# Patient Record
Sex: Female | Born: 1995 | Race: White | Hispanic: No | Marital: Single
Health system: Midwestern US, Academic
[De-identification: ages and names within clinical notes are randomized; demographics above are authoritative.]

---
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
## 2017-02-08 ENCOUNTER — Encounter: Admit: 2017-02-08 | Discharge: 2017-02-09 | Payer: Commercial Managed Care - PPO

## 2017-03-16 ENCOUNTER — Encounter: Admit: 2017-03-16 | Discharge: 2017-03-16 | Payer: Commercial Managed Care - PPO

## 2017-03-16 ENCOUNTER — Ambulatory Visit: Admit: 2017-03-16 | Discharge: 2017-03-17 | Payer: 59

## 2017-03-16 DIAGNOSIS — R569 Unspecified convulsions: ICD-10-CM
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
## 2017-03-16 NOTE — Assessment & Plan Note
Will refer to rehab medicine/TBI clinic for ongoing questions regarding spasticity management.

## 2017-03-17 ENCOUNTER — Encounter: Admit: 2017-03-17 | Discharge: 2017-03-17 | Payer: Commercial Managed Care - PPO

## 2017-03-17 DIAGNOSIS — S069X9S Unspecified intracranial injury with loss of consciousness of unspecified duration, sequela: Principal | ICD-10-CM

## 2017-03-17 NOTE — Telephone Encounter
Spoke with patient mom to schedule with Dr. Tammi KlippelArickx per referral from Dr. Jeraldine LootsHammond. Per conversation with mom, [facility] and Dr. Georgiann CockerMyles has recommended that she do botox injections for her spasticity. Family is requesting a second opinion. Mom also expressed that Dr. Tammi KlippelArickx, may be patient primary rehab physician as it is possible that patient will be moving home in January and February. Appointment and contact information provided.

## 2017-06-07 ENCOUNTER — Ambulatory Visit: Admit: 2017-06-07 | Discharge: 2017-06-07

## 2017-06-07 ENCOUNTER — Encounter: Admit: 2017-06-07 | Discharge: 2017-06-07 | Payer: Commercial Managed Care - PPO

## 2017-06-07 DIAGNOSIS — R4701 Aphasia: ICD-10-CM

## 2017-06-07 DIAGNOSIS — R1312 Dysphagia, oropharyngeal phase: Principal | ICD-10-CM

## 2017-06-07 DIAGNOSIS — Z9889 Other specified postprocedural states: ICD-10-CM

## 2017-06-16 ENCOUNTER — Ambulatory Visit: Admit: 2017-06-16 | Discharge: 2017-06-16

## 2017-06-16 DIAGNOSIS — R1312 Dysphagia, oropharyngeal phase: Principal | ICD-10-CM

## 2017-06-20 ENCOUNTER — Encounter: Admit: 2017-06-20 | Discharge: 2017-06-20 | Payer: Commercial Managed Care - PPO

## 2017-07-10 ENCOUNTER — Ambulatory Visit: Admit: 2017-07-10 | Discharge: 2017-07-11

## 2017-07-10 ENCOUNTER — Encounter: Admit: 2017-07-10 | Discharge: 2017-07-10 | Payer: Commercial Managed Care - PPO

## 2017-07-10 DIAGNOSIS — S069X9S Unspecified intracranial injury with loss of consciousness of unspecified duration, sequela: Secondary | ICD-10-CM

## 2017-07-10 DIAGNOSIS — G8250 Quadriplegia, unspecified: ICD-10-CM

## 2017-07-10 DIAGNOSIS — R252 Cramp and spasm: ICD-10-CM

## 2017-07-10 DIAGNOSIS — R239 Unspecified skin changes: ICD-10-CM

## 2017-07-10 DIAGNOSIS — N920 Excessive and frequent menstruation with regular cycle: ICD-10-CM

## 2017-07-14 ENCOUNTER — Encounter: Admit: 2017-07-14 | Discharge: 2017-07-14 | Payer: Commercial Managed Care - PPO

## 2017-07-14 DIAGNOSIS — S069X9A Unspecified intracranial injury with loss of consciousness of unspecified duration, initial encounter: Principal | ICD-10-CM

## 2017-07-26 ENCOUNTER — Encounter: Admit: 2017-07-26 | Discharge: 2017-07-26 | Payer: Commercial Managed Care - PPO

## 2017-08-03 ENCOUNTER — Encounter: Admit: 2017-08-03 | Discharge: 2017-08-03 | Payer: Commercial Managed Care - PPO

## 2017-08-03 DIAGNOSIS — S069X9A Unspecified intracranial injury with loss of consciousness of unspecified duration, initial encounter: Principal | ICD-10-CM

## 2017-08-04 ENCOUNTER — Encounter: Admit: 2017-08-04 | Discharge: 2017-08-04 | Payer: Commercial Managed Care - PPO

## 2017-09-20 ENCOUNTER — Encounter: Admit: 2017-09-20 | Discharge: 2017-09-20 | Payer: Commercial Managed Care - PPO

## 2017-09-20 MED ORDER — INCOBOTULINUMTOXINA 100 UNIT IM SOLR
500 [IU] | Freq: Once | INTRAMUSCULAR | 0 refills | Status: CP
Start: 2017-09-20 — End: ?
  Administered 2017-10-23: 19:00:00 500 [IU] via INTRAMUSCULAR

## 2017-09-22 ENCOUNTER — Encounter: Admit: 2017-09-22 | Discharge: 2017-09-22 | Payer: Commercial Managed Care - PPO

## 2017-10-04 ENCOUNTER — Encounter: Admit: 2017-10-04 | Discharge: 2017-10-04 | Payer: Commercial Managed Care - PPO

## 2017-10-06 ENCOUNTER — Encounter: Admit: 2017-10-06 | Discharge: 2017-10-06 | Payer: Commercial Managed Care - PPO

## 2017-10-23 ENCOUNTER — Ambulatory Visit: Admit: 2017-10-23 | Discharge: 2017-10-24

## 2017-10-23 ENCOUNTER — Encounter: Admit: 2017-10-23 | Discharge: 2017-10-23 | Payer: Commercial Managed Care - PPO

## 2017-10-23 DIAGNOSIS — Z7409 Other reduced mobility: ICD-10-CM

## 2017-10-23 DIAGNOSIS — S069X9A Unspecified intracranial injury with loss of consciousness of unspecified duration, initial encounter: Principal | ICD-10-CM

## 2017-10-23 DIAGNOSIS — M25671 Stiffness of right ankle, not elsewhere classified: ICD-10-CM

## 2017-10-24 DIAGNOSIS — R252 Cramp and spasm: Principal | ICD-10-CM

## 2017-11-03 ENCOUNTER — Ambulatory Visit: Admit: 2017-11-03 | Discharge: 2017-11-04

## 2017-11-03 ENCOUNTER — Encounter: Admit: 2017-11-03 | Discharge: 2017-11-03 | Payer: Commercial Managed Care - PPO

## 2017-11-03 DIAGNOSIS — S069X6D Unspecified intracranial injury with loss of consciousness greater than 24 hours without return to pre-existing conscious level with patient surviving, subsequent encounter: ICD-10-CM

## 2017-11-03 DIAGNOSIS — R1312 Dysphagia, oropharyngeal phase: Principal | ICD-10-CM

## 2017-11-03 DIAGNOSIS — R4701 Aphasia: ICD-10-CM

## 2017-11-03 DIAGNOSIS — Z9889 Other specified postprocedural states: ICD-10-CM

## 2017-11-03 DIAGNOSIS — S069X9A Unspecified intracranial injury with loss of consciousness of unspecified duration, initial encounter: Principal | ICD-10-CM

## 2017-11-25 ENCOUNTER — Encounter: Admit: 2017-11-25 | Discharge: 2017-11-25 | Payer: Commercial Managed Care - PPO

## 2017-11-25 DIAGNOSIS — I2699 Other pulmonary embolism without acute cor pulmonale: ICD-10-CM

## 2017-11-25 DIAGNOSIS — I639 Cerebral infarction, unspecified: ICD-10-CM

## 2017-11-25 DIAGNOSIS — S069X9A Unspecified intracranial injury with loss of consciousness of unspecified duration, initial encounter: Principal | ICD-10-CM

## 2017-11-30 ENCOUNTER — Encounter: Admit: 2017-11-30 | Discharge: 2017-11-30 | Payer: Commercial Managed Care - PPO

## 2017-12-21 ENCOUNTER — Encounter: Admit: 2017-12-21 | Discharge: 2017-12-21 | Payer: Commercial Managed Care - PPO

## 2017-12-21 MED ORDER — INCOBOTULINUMTOXINA 100 UNIT IM SOLR
500 [IU] | Freq: Once | INTRAMUSCULAR | 0 refills | Status: CP
Start: 2017-12-21 — End: ?
  Administered 2018-01-24: 19:00:00 500 [IU] via INTRAMUSCULAR

## 2018-01-08 ENCOUNTER — Encounter: Admit: 2018-01-08 | Discharge: 2018-01-08 | Payer: Commercial Managed Care - PPO

## 2018-01-08 ENCOUNTER — Ambulatory Visit: Admit: 2018-01-08 | Discharge: 2018-01-08

## 2018-01-08 ENCOUNTER — Ambulatory Visit: Admit: 2018-01-08 | Discharge: 2018-01-08 | Payer: Commercial Managed Care - PPO

## 2018-01-08 DIAGNOSIS — S069X9A Unspecified intracranial injury with loss of consciousness of unspecified duration, initial encounter: Principal | ICD-10-CM

## 2018-01-08 DIAGNOSIS — I2699 Other pulmonary embolism without acute cor pulmonale: ICD-10-CM

## 2018-01-08 DIAGNOSIS — G40209 Localization-related (focal) (partial) symptomatic epilepsy and epileptic syndromes with complex partial seizures, not intractable, without status epilepticus: Principal | ICD-10-CM

## 2018-01-08 DIAGNOSIS — I639 Cerebral infarction, unspecified: ICD-10-CM

## 2018-01-08 MED ORDER — LAMOTRIGINE 25 MG PO TAB
50 mg | ORAL_TABLET | Freq: Two times a day (BID) | ORAL | 5 refills | Status: AC
Start: 2018-01-08 — End: 2018-02-01

## 2018-01-08 MED ORDER — LAMOTRIGINE 100 MG PO TAB
100 mg | ORAL_TABLET | Freq: Two times a day (BID) | ORAL | 5 refills | Status: AC
Start: 2018-01-08 — End: 2018-02-01

## 2018-01-24 ENCOUNTER — Ambulatory Visit: Admit: 2018-01-24 | Discharge: 2018-01-25

## 2018-01-24 ENCOUNTER — Encounter: Admit: 2018-01-24 | Discharge: 2018-01-24 | Payer: Commercial Managed Care - PPO

## 2018-01-24 DIAGNOSIS — S069X9A Unspecified intracranial injury with loss of consciousness of unspecified duration, initial encounter: Principal | ICD-10-CM

## 2018-01-24 DIAGNOSIS — I639 Cerebral infarction, unspecified: ICD-10-CM

## 2018-01-24 DIAGNOSIS — I2699 Other pulmonary embolism without acute cor pulmonale: ICD-10-CM

## 2018-01-24 DIAGNOSIS — M25661 Stiffness of right knee, not elsewhere classified: ICD-10-CM

## 2018-01-25 DIAGNOSIS — R252 Cramp and spasm: Principal | ICD-10-CM

## 2018-02-01 ENCOUNTER — Encounter: Admit: 2018-02-01 | Discharge: 2018-02-01 | Payer: Commercial Managed Care - PPO

## 2018-02-01 MED ORDER — LEVETIRACETAM 500 MG PO TAB
500 mg | ORAL_TABLET | Freq: Two times a day (BID) | ORAL | 3 refills | 90.00000 days | Status: AC
Start: 2018-02-01 — End: 2018-05-31

## 2018-03-22 ENCOUNTER — Encounter: Admit: 2018-03-22 | Discharge: 2018-03-22 | Payer: Commercial Managed Care - PPO

## 2018-03-26 ENCOUNTER — Encounter: Admit: 2018-03-26 | Discharge: 2018-03-26 | Payer: Commercial Managed Care - PPO

## 2018-03-26 DIAGNOSIS — R252 Cramp and spasm: Principal | ICD-10-CM

## 2018-03-26 MED ORDER — INCOBOTULINUMTOXINA 100 UNIT IM SOLR
500 [IU] | Freq: Once | INTRAMUSCULAR | 0 refills | Status: CP
Start: 2018-03-26 — End: ?
  Administered 2018-04-30: 21:00:00 500 [IU] via INTRAMUSCULAR

## 2018-04-30 ENCOUNTER — Encounter: Admit: 2018-04-30 | Discharge: 2018-04-30 | Payer: Commercial Managed Care - PPO

## 2018-04-30 ENCOUNTER — Ambulatory Visit: Admit: 2018-04-30 | Discharge: 2018-05-01

## 2018-04-30 DIAGNOSIS — S069X9S Unspecified intracranial injury with loss of consciousness of unspecified duration, sequela: ICD-10-CM

## 2018-04-30 DIAGNOSIS — S069X9A Unspecified intracranial injury with loss of consciousness of unspecified duration, initial encounter: Principal | ICD-10-CM

## 2018-04-30 DIAGNOSIS — I639 Cerebral infarction, unspecified: ICD-10-CM

## 2018-04-30 DIAGNOSIS — I2699 Other pulmonary embolism without acute cor pulmonale: ICD-10-CM

## 2018-05-01 DIAGNOSIS — R252 Cramp and spasm: Principal | ICD-10-CM

## 2018-05-18 ENCOUNTER — Encounter: Admit: 2018-05-18 | Discharge: 2018-05-18 | Payer: Commercial Managed Care - PPO

## 2018-05-18 ENCOUNTER — Ambulatory Visit: Admit: 2018-05-18 | Discharge: 2018-05-19

## 2018-05-18 DIAGNOSIS — J9504 Tracheo-esophageal fistula following tracheostomy: Secondary | ICD-10-CM

## 2018-05-18 DIAGNOSIS — S069X9A Unspecified intracranial injury with loss of consciousness of unspecified duration, initial encounter: Secondary | ICD-10-CM

## 2018-05-18 DIAGNOSIS — I639 Cerebral infarction, unspecified: Secondary | ICD-10-CM

## 2018-05-18 DIAGNOSIS — I2699 Other pulmonary embolism without acute cor pulmonale: Secondary | ICD-10-CM

## 2018-05-18 DIAGNOSIS — Z9889 Other specified postprocedural states: Secondary | ICD-10-CM

## 2018-05-18 DIAGNOSIS — R1314 Dysphagia, pharyngoesophageal phase: Secondary | ICD-10-CM

## 2018-05-18 DIAGNOSIS — R1312 Dysphagia, oropharyngeal phase: Secondary | ICD-10-CM

## 2018-05-18 DIAGNOSIS — G8250 Quadriplegia, unspecified: Secondary | ICD-10-CM

## 2018-05-18 DIAGNOSIS — R4701 Aphasia: Secondary | ICD-10-CM

## 2018-05-24 ENCOUNTER — Encounter: Admit: 2018-05-24 | Discharge: 2018-05-24 | Payer: Commercial Managed Care - PPO

## 2018-05-24 DIAGNOSIS — S069X9A Unspecified intracranial injury with loss of consciousness of unspecified duration, initial encounter: Secondary | ICD-10-CM

## 2018-05-24 DIAGNOSIS — I639 Cerebral infarction, unspecified: Secondary | ICD-10-CM

## 2018-05-24 DIAGNOSIS — I2699 Other pulmonary embolism without acute cor pulmonale: Secondary | ICD-10-CM

## 2018-05-31 ENCOUNTER — Ambulatory Visit: Admit: 2018-05-31 | Discharge: 2018-06-01

## 2018-05-31 ENCOUNTER — Encounter: Admit: 2018-05-31 | Discharge: 2018-05-31 | Payer: Commercial Managed Care - PPO

## 2018-05-31 DIAGNOSIS — I639 Cerebral infarction, unspecified: Secondary | ICD-10-CM

## 2018-05-31 DIAGNOSIS — I2699 Other pulmonary embolism without acute cor pulmonale: Secondary | ICD-10-CM

## 2018-05-31 DIAGNOSIS — G5139 Clonic hemifacial spasm, unspecified: Secondary | ICD-10-CM

## 2018-05-31 DIAGNOSIS — S069X9A Unspecified intracranial injury with loss of consciousness of unspecified duration, initial encounter: Secondary | ICD-10-CM

## 2018-05-31 DIAGNOSIS — G40209 Localization-related (focal) (partial) symptomatic epilepsy and epileptic syndromes with complex partial seizures, not intractable, without status epilepticus: Secondary | ICD-10-CM

## 2018-05-31 MED ORDER — GABAPENTIN 250 MG/5 ML (5 ML) PO SOLN
125 mg | Freq: Three times a day (TID) | ORAL | 3 refills | Status: AC
Start: 2018-05-31 — End: 2018-11-29

## 2018-06-13 ENCOUNTER — Ambulatory Visit: Admit: 2018-06-13 | Discharge: 2018-06-13 | Payer: Commercial Managed Care - PPO

## 2018-06-14 ENCOUNTER — Encounter: Admit: 2018-06-14 | Discharge: 2018-06-14 | Payer: Commercial Managed Care - PPO

## 2018-06-14 ENCOUNTER — Ambulatory Visit: Admit: 2018-06-14 | Discharge: 2018-06-14 | Payer: Commercial Managed Care - PPO

## 2018-06-14 ENCOUNTER — Ambulatory Visit: Admit: 2018-06-14 | Discharge: 2018-06-14

## 2018-06-14 DIAGNOSIS — R1314 Dysphagia, pharyngoesophageal phase: ICD-10-CM

## 2018-06-14 DIAGNOSIS — I2699 Other pulmonary embolism without acute cor pulmonale: ICD-10-CM

## 2018-06-14 DIAGNOSIS — G8250 Quadriplegia, unspecified: ICD-10-CM

## 2018-06-14 DIAGNOSIS — Z87820 Personal history of traumatic brain injury: Secondary | ICD-10-CM

## 2018-06-14 DIAGNOSIS — J9504 Tracheo-esophageal fistula following tracheostomy: Principal | ICD-10-CM

## 2018-06-14 DIAGNOSIS — R4701 Aphasia: ICD-10-CM

## 2018-06-14 DIAGNOSIS — S069X9A Unspecified intracranial injury with loss of consciousness of unspecified duration, initial encounter: Principal | ICD-10-CM

## 2018-06-14 DIAGNOSIS — I639 Cerebral infarction, unspecified: ICD-10-CM

## 2018-06-14 LAB — BETA-HCG: Lab: <1 U/L — ABNORMAL LOW (ref <5)

## 2018-06-14 MED ORDER — CEFAZOLIN 1 GRAM IJ SOLR
0 refills | Status: DC
Start: 2018-06-14 — End: 2018-06-14
  Administered 2018-06-14: 17:00:00 2 g via INTRAVENOUS

## 2018-06-14 MED ORDER — PROPOFOL 10 MG/ML IV EMUL 50 ML (INFUSION)(AM)(OR)
INTRAVENOUS | 0 refills | Status: DC
Start: 2018-06-14 — End: 2018-06-14
  Administered 2018-06-14: 17:00:00 20 ug/kg/min via INTRAVENOUS

## 2018-06-14 MED ORDER — FENTANYL CITRATE (PF) 50 MCG/ML IJ SOLN
50 ug | INTRAVENOUS | 0 refills | Status: DC | PRN
Start: 2018-06-14 — End: 2018-06-14

## 2018-06-14 MED ORDER — CEPHALEXIN 500 MG PO CAP
500 mg | ORAL_CAPSULE | Freq: Three times a day (TID) | ORAL | 0 refills | Status: AC
Start: 2018-06-14 — End: ?

## 2018-06-14 MED ORDER — OXYCODONE 5 MG PO TAB
5-10 mg | Freq: Once | ORAL | 0 refills | Status: DC | PRN
Start: 2018-06-14 — End: 2018-06-14

## 2018-06-14 MED ORDER — LIDOCAINE 1%-EPINEPHRINE 1:100000 (BUFFERED) VIAL
0 refills | Status: DC
Start: 2018-06-14 — End: 2018-06-14
  Administered 2018-06-14 (×2): 10 mL via INTRAMUSCULAR

## 2018-06-14 MED ORDER — HYDROMORPHONE (PF) 2 MG/ML IJ SYRG
1-2 mg | Freq: Once | INTRAVENOUS | 0 refills | Status: DC | PRN
Start: 2018-06-14 — End: 2018-06-14

## 2018-06-14 MED ORDER — LIDOCAINE (PF) 10 MG/ML (1 %) IJ SOLN
.1-2 mL | INTRAMUSCULAR | 0 refills | Status: DC | PRN
Start: 2018-06-14 — End: 2018-06-14

## 2018-06-14 MED ORDER — TRAMADOL 50 MG PO TAB
50 mg | ORAL_TABLET | GASTROSTOMY | 0 refills | Status: AC | PRN
Start: 2018-06-14 — End: 2019-03-06

## 2018-06-14 MED ORDER — PROMETHAZINE 25 MG/ML IJ SOLN
6.25 mg | INTRAVENOUS | 0 refills | Status: DC | PRN
Start: 2018-06-14 — End: 2018-06-14

## 2018-06-14 MED ORDER — FENTANYL CITRATE (PF) 50 MCG/ML IJ SOLN
25 ug | INTRAVENOUS | 0 refills | Status: DC | PRN
Start: 2018-06-14 — End: 2018-06-14

## 2018-06-14 MED ORDER — HALOPERIDOL LACTATE 5 MG/ML IJ SOLN
1 mg | Freq: Once | INTRAVENOUS | 0 refills | Status: DC | PRN
Start: 2018-06-14 — End: 2018-06-14

## 2018-06-14 MED ORDER — DIPHENHYDRAMINE HCL 50 MG/ML IJ SOLN
25 mg | Freq: Once | INTRAVENOUS | 0 refills | Status: DC | PRN
Start: 2018-06-14 — End: 2018-06-14

## 2018-06-14 MED ORDER — SODIUM CHLORIDE 0.9 % IV SOLP
INTRAVENOUS | 0 refills | Status: DC
Start: 2018-06-14 — End: 2018-06-14

## 2018-06-14 MED ADMIN — SODIUM CHLORIDE 0.9 % IV SOLP [27838]: INTRAVENOUS | @ 15:00:00 | Stop: 2018-06-14 | NDC 00338004904

## 2018-06-14 NOTE — Anesthesia Pre-Procedure Evaluation
Anesthesia Pre-Procedure Evaluation    Name: Yumna Ebers      MRN: [id number]     DOB: [birth year]     Age: 23 y.o.     Sex: female   __________________________________________________________________________     Procedure Date: 06/14/2018   Procedure: Procedure(s) with comments:  SURGICAL CLOSURE TRACHEOSTOMY/ FISTULA WITHOUT PLASTIC REPAIR - CASE LENGTH 1 HOUR, HEAD AND NECK SET     Physical Assessment  Vital Signs (last filed in past 24 hours):  BP: 132/82 (02/13 0849)  Temp: 36.5 ???C (97.7 ???F) (02/13 0820)  Pulse: 76 (02/13 0820)  Respirations: 13 PER MINUTE (02/13 0820)  SpO2: 100 % (02/13 0820)  Height: 172.7 cm (68) (02/13 0820)  Weight: 79.4 kg (175 lb) (02/13 0820)`      Patient History  Allergies   Allergen Reactions   ??? Amantadine RASH and UNKNOWN   ??? Lamictal [Lamotrigine] RASH        Current Medications    Medication Directions   acetaminophen SR(+) (TYLENOL) 650 mg tablet Take 650 mg by mouth every 6 hours as needed for Pain.   baclofen (LIORESAL) 20 mg tablet Take 20 mg by mouth three times daily.   bisacodyl (DULCOLAX) 10 mg rectal suppository Insert or Apply 10 mg to rectal area as directed as Needed.   CALCIUM PO Take 600 mg by mouth. Indications: with Vitamin D   chlorhexidine gluconate (PERIDEX) 0.12 % solution Swish and Spit 15 mL by mouth as directed twice daily.   cloNIDine (CATAPRESS) 0.1 mg tablet Take 0.5 mg by mouth 1 & 3 hours after meals and at bedtime as needed.   cyanocobalamin (VITAMIN B-12) 100 mcg tablet Take 100 mcg by mouth daily.   Cyanocobalamin 1,000 mcg/15 mL liqd Take 1,000 mcg by mouth daily.   DIAZEPAM PO Take 2.5 mg by mouth every 6 hours as needed.   docosahexanoic acid/epa (FISH OIL PO) Take 1,000 Units by mouth daily.   esomeprazole DR(+) (NEXIUM) 20 mg capsule Take 20 mg by mouth daily as needed. Take on an empty stomach at least 1 hour before or 2 hours after food.    gabapentin 250 mg/5 mL (5 mL) soln Take 125 mg by mouth three times daily. 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 To remain on for 6 weeks.     Physical Exam    Airway Findings      Mallampati: II      TM distance: >3 FB      Neck ROM: limited      Pre-existing airway: ETT  Dental Findings:       Poor dentition and increased risk for dental injury; pt advised      Comments: UTA    Cardiovascular Findings: Negative      Rhythm: regular      Pulmonary Findings: Negative      Breath sounds clear to auscultation.    Neurological Findings:         Altered mental status      Paralysis       Diagnostic Tests  Hematology:   Lab Results   Component Value Date    HGB 11.5 04/06/2016    HCT 33.8 04/06/2016    PLTCT 384 04/06/2016    WBC 16.6 04/06/2016    NEUT 74 03/30/2016    ANC 8.90 03/30/2016    ALC 2.20 03/30/2016    MONA 6 03/30/2016    AMC 0.80 03/30/2016    EOSA 2 03/30/2016    ABC 0.00 03/30/2016    MCV 89.6 04/06/2016    MCH 30.5 04/06/2016    MCHC 34.0 04/06/2016    MPV 8.1 04/06/2016    RDW 13.8 04/06/2016         General Chemistry:   Lab Results   Component Value Date    NA 139 04/06/2016    K 4.4 04/06/2016    CL 103 04/06/2016    CO2 27 04/06/2016    GAP 9 04/06/2016    BUN 33 04/06/2016    CR 0.55 04/06/2016    GLU 142 04/06/2016    CA 10.1 04/06/2016    ALBUMIN 3.4 03/24/2016    LACTIC 2.6 03/24/2016    MG 2.1 04/06/2016    TOTBILI 0.4 03/24/2016    PO4 4.1 04/06/2016      Coagulation:   Lab Results   Component Value Date PTT 68.1 04/05/2016    INR 1.1 04/06/2016         Anesthesia Plan    ASA score: 3   Plan: general  Induction method: intravenous  NPO status: acceptable      Informed Consent  Anesthetic plan and risks discussed with mother.  Use of blood products discussed with mother  Blood Consent: consented      Plan discussed with: anesthesiologist and resident.

## 2018-06-16 ENCOUNTER — Encounter: Admit: 2018-06-16 | Discharge: 2018-06-16 | Payer: Commercial Managed Care - PPO

## 2018-06-16 DIAGNOSIS — I639 Cerebral infarction, unspecified: ICD-10-CM

## 2018-06-16 DIAGNOSIS — I2699 Other pulmonary embolism without acute cor pulmonale: ICD-10-CM

## 2018-06-16 DIAGNOSIS — S069X9A Unspecified intracranial injury with loss of consciousness of unspecified duration, initial encounter: Principal | ICD-10-CM

## 2018-06-18 ENCOUNTER — Encounter: Admit: 2018-06-18 | Discharge: 2018-06-18 | Payer: Commercial Managed Care - PPO

## 2018-06-18 DIAGNOSIS — Z7409 Other reduced mobility: ICD-10-CM

## 2018-06-18 DIAGNOSIS — G8250 Quadriplegia, unspecified: ICD-10-CM

## 2018-06-18 DIAGNOSIS — R252 Cramp and spasm: Principal | ICD-10-CM

## 2018-06-21 NOTE — Progress Notes
Pre Visit Planning- Return Patient     Last office visit note reviewed: Yes     Records received: N/A     Orders have been NA    Patient active in MyChart. No appointment reminder sent.     Imaging: N/A    Special Equipment: N/A

## 2018-06-25 ENCOUNTER — Encounter: Admit: 2018-06-25 | Discharge: 2018-06-25 | Payer: Commercial Managed Care - PPO

## 2018-06-28 ENCOUNTER — Encounter: Admit: 2018-06-28 | Discharge: 2018-06-28 | Payer: Commercial Managed Care - PPO

## 2018-06-29 ENCOUNTER — Encounter: Admit: 2018-06-29 | Discharge: 2018-06-29 | Payer: Commercial Managed Care - PPO

## 2018-06-29 ENCOUNTER — Ambulatory Visit: Admit: 2018-06-29 | Discharge: 2018-06-30

## 2018-06-29 DIAGNOSIS — R1312 Dysphagia, oropharyngeal phase: Principal | ICD-10-CM

## 2018-06-29 DIAGNOSIS — S069X9A Unspecified intracranial injury with loss of consciousness of unspecified duration, initial encounter: Principal | ICD-10-CM

## 2018-06-29 DIAGNOSIS — Z9889 Other specified postprocedural states: ICD-10-CM

## 2018-06-29 DIAGNOSIS — I2699 Other pulmonary embolism without acute cor pulmonale: ICD-10-CM

## 2018-06-29 DIAGNOSIS — J9504 Tracheo-esophageal fistula following tracheostomy: ICD-10-CM

## 2018-06-29 DIAGNOSIS — I639 Cerebral infarction, unspecified: ICD-10-CM

## 2018-07-30 ENCOUNTER — Encounter: Admit: 2018-07-30 | Discharge: 2018-07-30 | Payer: Commercial Managed Care - PPO

## 2018-07-31 ENCOUNTER — Encounter: Admit: 2018-07-31 | Discharge: 2018-07-31 | Payer: Commercial Managed Care - PPO

## 2018-07-31 DIAGNOSIS — R252 Cramp and spasm: Principal | ICD-10-CM

## 2018-08-13 ENCOUNTER — Encounter: Admit: 2018-08-13 | Discharge: 2018-08-13 | Payer: Commercial Managed Care - PPO

## 2018-08-14 ENCOUNTER — Encounter: Admit: 2018-08-14 | Discharge: 2018-08-14 | Payer: Commercial Managed Care - PPO

## 2018-08-22 ENCOUNTER — Ambulatory Visit: Admit: 2018-08-22 | Discharge: 2018-08-23

## 2018-08-22 ENCOUNTER — Encounter: Admit: 2018-08-22 | Discharge: 2018-08-22 | Payer: Commercial Managed Care - PPO

## 2018-08-22 DIAGNOSIS — I639 Cerebral infarction, unspecified: ICD-10-CM

## 2018-08-22 DIAGNOSIS — I2699 Other pulmonary embolism without acute cor pulmonale: ICD-10-CM

## 2018-08-22 DIAGNOSIS — S069X9A Unspecified intracranial injury with loss of consciousness of unspecified duration, initial encounter: Principal | ICD-10-CM

## 2018-08-22 MED ORDER — INCOBOTULINUMTOXINA 100 UNIT IM SOLR
500 [IU] | Freq: Once | INTRAMUSCULAR | 0 refills | Status: CP
Start: 2018-08-22 — End: ?
  Administered 2018-08-22: 16:00:00 500 [IU] via INTRAMUSCULAR

## 2018-08-23 DIAGNOSIS — R252 Cramp and spasm: Principal | ICD-10-CM

## 2018-09-19 ENCOUNTER — Encounter: Admit: 2018-09-19 | Discharge: 2018-09-19 | Payer: Commercial Managed Care - PPO

## 2018-09-28 ENCOUNTER — Encounter: Admit: 2018-09-28 | Discharge: 2018-09-28 | Payer: Commercial Managed Care - PPO

## 2018-10-24 ENCOUNTER — Encounter: Admit: 2018-10-24 | Discharge: 2018-10-24

## 2018-10-24 ENCOUNTER — Ambulatory Visit: Admit: 2018-10-24 | Discharge: 2018-10-25

## 2018-10-24 DIAGNOSIS — S069X9A Unspecified intracranial injury with loss of consciousness of unspecified duration, initial encounter: Secondary | ICD-10-CM

## 2018-10-24 DIAGNOSIS — I639 Cerebral infarction, unspecified: Secondary | ICD-10-CM

## 2018-10-24 DIAGNOSIS — I2699 Other pulmonary embolism without acute cor pulmonale: Secondary | ICD-10-CM

## 2018-10-24 DIAGNOSIS — S069X6D Unspecified intracranial injury with loss of consciousness greater than 24 hours without return to pre-existing conscious level with patient surviving, subsequent encounter: Secondary | ICD-10-CM

## 2018-10-24 DIAGNOSIS — S062X6A Diffuse traumatic brain injury with loss of consciousness greater than 24 hours without return to pre-existing conscious level with patient surviving, initial encounter: Secondary | ICD-10-CM
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
## 2018-11-20 ENCOUNTER — Encounter: Admit: 2018-11-20 | Discharge: 2018-11-20

## 2018-11-20 ENCOUNTER — Ambulatory Visit: Admit: 2018-11-20 | Discharge: 2018-11-21

## 2018-11-20 DIAGNOSIS — I2699 Other pulmonary embolism without acute cor pulmonale: Secondary | ICD-10-CM

## 2018-11-20 DIAGNOSIS — J9504 Tracheo-esophageal fistula following tracheostomy: Secondary | ICD-10-CM

## 2018-11-20 DIAGNOSIS — S069X9A Unspecified intracranial injury with loss of consciousness of unspecified duration, initial encounter: Secondary | ICD-10-CM

## 2018-11-20 DIAGNOSIS — R1312 Dysphagia, oropharyngeal phase: Principal | ICD-10-CM

## 2018-11-20 DIAGNOSIS — R4701 Aphasia: Secondary | ICD-10-CM

## 2018-11-20 DIAGNOSIS — I639 Cerebral infarction, unspecified: Secondary | ICD-10-CM

## 2018-11-20 DIAGNOSIS — Z9889 Other specified postprocedural states: Secondary | ICD-10-CM

## 2018-11-29 ENCOUNTER — Encounter: Admit: 2018-11-29 | Discharge: 2018-11-29

## 2018-11-29 ENCOUNTER — Ambulatory Visit: Admit: 2018-11-29 | Discharge: 2018-11-30

## 2018-11-29 DIAGNOSIS — G40209 Localization-related (focal) (partial) symptomatic epilepsy and epileptic syndromes with complex partial seizures, not intractable, without status epilepticus: Secondary | ICD-10-CM

## 2018-11-29 DIAGNOSIS — I2699 Other pulmonary embolism without acute cor pulmonale: Secondary | ICD-10-CM

## 2018-11-29 DIAGNOSIS — I639 Cerebral infarction, unspecified: Secondary | ICD-10-CM

## 2018-11-29 DIAGNOSIS — S069X9A Unspecified intracranial injury with loss of consciousness of unspecified duration, initial encounter: Secondary | ICD-10-CM

## 2018-11-29 MED ORDER — LEVETIRACETAM 100 MG/ML PO SOLN
500 mg | Freq: Two times a day (BID) | ORAL | 11 refills | 90.00000 days | Status: DC
Start: 2018-11-29 — End: 2019-06-03

## 2018-11-29 MED ORDER — GABAPENTIN 250 MG/5 ML (5 ML) PO SOLN
300 mg | Freq: Three times a day (TID) | ORAL | 3 refills | Status: DC
Start: 2018-11-29 — End: 2019-06-03

## 2018-11-29 NOTE — Assessment & Plan Note
Patient is doing well. No known seizures since June 2019.     Continue Keppra, liquid, 500 mg BID  Increase gabapentin slowly to 300mg  TID

## 2018-11-29 NOTE — Progress Notes
Date of Service: 11/29/2018    Subjective:             Sherri [name] is a 23 y.o. female.    History of Present Illness    Continues to have near constant right face clonic movements.  Her mom feels gabapentin has helped decrease the amplitude of the facial movements but not the frequency.  She has not had any seizures that mom is aware of. Tolerating medications ok.         Review of Systems   All other systems reviewed and are negative.        Objective:         ??? acetaminophen SR(+) (TYLENOL) 650 mg tablet Take 650 mg by mouth every 6 hours as needed for Pain.   ??? baclofen (LIORESAL) 20 mg tablet Take 20 mg by mouth three times daily.   ??? bisacodyl (DULCOLAX) 10 mg rectal suppository Insert or Apply 10 mg to rectal area as directed as Needed.   ??? CALCIUM PO Take 600 mg by mouth. Indications: with Vitamin D   ??? chlorhexidine gluconate (PERIDEX) 0.12 % solution Swish and Spit 15 mL by mouth as directed twice daily.   ??? cloNIDine (CATAPRESS) 0.1 mg tablet Take 0.5 mg by mouth 1 & 3 hours after meals and at bedtime as needed.   ??? cyanocobalamin (VITAMIN B-12) 100 mcg tablet Take 100 mcg by mouth daily.   ??? Cyanocobalamin 1,000 mcg/15 mL liqd Take 1,000 mcg by mouth daily.   ??? DIAZEPAM PO Take 2.5 mg by mouth every 6 hours as needed.   ??? docosahexanoic acid/epa (FISH OIL PO) Take 1,000 Units by mouth daily.   ??? esomeprazole DR(+) (NEXIUM) 20 mg capsule Take 20 mg by mouth daily as needed. Take on an empty stomach at least 1 hour before or 2 hours after food.    ??? gabapentin 250 mg/5 mL (5 mL) soln Take 125 mg by mouth three times daily.   ??? guaiFENesin (ROBITUSSIN) 100 mg/5 mL oral solution Take 30 mL by mouth every 4 hours as needed.   ??? hydrocortisone 1 % topical cream Apply  topically to affected area three times daily as needed.   ??? Lactobacillus acidophilus (ACIDOPHILUS PO) Take  by mouth twice daily.   ??? levalbuterol(+) (XOPENEX) 1.25 mg/3 mL nebulizer solution Inhale 1.25 mg solution by nebulizer as directed every 4 hours as needed for Wheezing.   ??? levETIRAcetam (KEPPRA) 100 mg/mL oral solution Take 500 mg/kg by mouth twice daily.   ??? medroxyPROGESTERone (contraceptive) (DEPO-PROVERA) syringe Inject 150 mg into the muscle every 90 days.   ??? melatonin 3 mg tab Take 3 mg by mouth at bedtime daily.   ??? mupirocin (BACTROBAN) 2 % topical ointment Apply  topically to affected area twice daily.   ??? nystatin (NYSTOP) 100,000 unit/g topical powder Apply  topically to affected area four times daily.   ??? oxyCODONE (ROXICODONE) 1 mg/mL oral solution Take 5-15 mL by mouth every 4 hours as needed for Pain Earliest Fill Date: 04/06/16 GIVE VIA PEG   ??? polyethylene glycol 3350 (MIRALAX) 17 g packet Take 1 packet by mouth daily. GIVE VIA PEG   ??? polyvinyl alcohol (LIQUIFILM TEARS) 1.4 % ophthalmic solution Place 1 drop into or around eye(s) daily.   ??? ranitidine(+) (ZANTAC) 15 mg/mL oral syrup Take 75 mg by mouth daily as needed.   ??? senna/docusate (SENOKOT-S) 8.8/50 mg /10 mL solution 10 mL by PEG Tube route twice daily.   ???  sertraline (ZOLOFT) 25 mg tablet Take 50 mg by mouth at bedtime daily.   ??? simethicone (MYLICON) 80 mg chew tablet Chew 80 mg by mouth every 6 hours as needed for Flatulence.   ??? sodium chloride (SEA MIST) 0.65 % nasal spray Apply 1-2 sprays to each nostril as directed three times daily as needed.   ??? traMADol (ULTRAM) 50 mg tablet one tablet by Per G Tube route every 6 hours as needed for Pain.   ??? zinc oxide 20 % topical ointment Apply  topically to affected area as Needed (Every 2 hours as needed).     Vitals:    11/29/18 1455   BP: 125/69   Pulse: 91     There is no height or weight on file to calculate BMI.     Physical Exam  Physical Exam  Patient is alert. Is able to squeeze hands to command bilaterally.   Tone is increased in bil UE and bil LE.   Continual right face clonic movements.  Wheelchair bound  ???       Assessment and Plan:    Problem Partial Symptomatic Epilepsy With Complex Partial Seizures, Not Intractable, Without Status Epilepticus (Hcc)        Partial symptomatic epilepsy with complex partial seizures, not intractable, without status epilepticus (HCC)  Patient is doing well. No known seizures since June 2019.     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  It has been present for two years and minimal effect from gabapentin    -Will discuss with movement disorder specialist  -Her mom to keep track of whether she feels it bothers her or not  -May consider botox    RTC: 6 months    Patient seen and discussed with Dr. Joya Martyr  Epilepsy Fellow       ATTESTATION    I personally performed the key portions of the E/M visit, discussed case with resident and concur with resident documentation of history, physical exam, assessment, and treatment plan unless otherwise noted.    Staff name:  Jacobo Forest, MD Date:  11/29/2018

## 2018-11-30 ENCOUNTER — Encounter: Admit: 2018-11-30 | Discharge: 2018-11-30

## 2018-11-30 DIAGNOSIS — R252 Cramp and spasm: Secondary | ICD-10-CM

## 2018-11-30 MED ORDER — INCOBOTULINUMTOXINA 100 UNIT IM SOLR
500 [IU] | Freq: Once | INTRAMUSCULAR | 0 refills | Status: CP
Start: 2018-11-30 — End: ?
  Administered 2018-12-05: 17:00:00 500 [IU] via INTRAMUSCULAR

## 2018-12-05 ENCOUNTER — Ambulatory Visit: Admit: 2018-12-05 | Discharge: 2018-12-05

## 2018-12-05 ENCOUNTER — Encounter: Admit: 2018-12-05 | Discharge: 2018-12-05

## 2018-12-05 DIAGNOSIS — M25661 Stiffness of right knee, not elsewhere classified: Secondary | ICD-10-CM

## 2018-12-05 DIAGNOSIS — R1312 Dysphagia, oropharyngeal phase: Secondary | ICD-10-CM

## 2018-12-05 DIAGNOSIS — R252 Cramp and spasm: Secondary | ICD-10-CM

## 2018-12-05 DIAGNOSIS — G8250 Quadriplegia, unspecified: Secondary | ICD-10-CM

## 2018-12-05 DIAGNOSIS — S069X9S Unspecified intracranial injury with loss of consciousness of unspecified duration, sequela: Secondary | ICD-10-CM

## 2018-12-05 DIAGNOSIS — R4701 Aphasia: Secondary | ICD-10-CM

## 2018-12-05 DIAGNOSIS — Z7409 Other reduced mobility: Secondary | ICD-10-CM

## 2018-12-05 DIAGNOSIS — M25671 Stiffness of right ankle, not elsewhere classified: Secondary | ICD-10-CM

## 2018-12-05 MED ORDER — BARIUM SULFATE 40 % (W/V) 29% (W/W) PO SUSP
10 mL | Freq: Once | ORAL | 0 refills | Status: DC
Start: 2018-12-05 — End: 2018-12-10

## 2018-12-05 MED ORDER — BARIUM SULFATE 40 % (W/V) PO SUSP
40 mL | Freq: Once | ORAL | 0 refills | Status: CP
Start: 2018-12-05 — End: ?
  Administered 2018-12-05: 18:00:00 3 mL via ORAL

## 2018-12-05 MED ORDER — BARIUM SULFATE 40 % (W/V), 30% (W/W) PO PSTE
10 mL | Freq: Once | ORAL | 0 refills | Status: CP
Start: 2018-12-05 — End: ?
  Administered 2018-12-05: 18:00:00 10 mL via ORAL

## 2018-12-05 MED ORDER — BARIUM SULFATE 40 % (W/V) PO POWD
75 mL | Freq: Once | ORAL | 0 refills | Status: DC
Start: 2018-12-05 — End: 2018-12-10

## 2018-12-06 NOTE — Procedures
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  Patient is noted to be in the manual wheelchair with knee extension contractures and bilateral ankles in equinovarus positioning.  Her left arm continues to have MAS 2 tone during elbow extension.  She remains non-verbal.    Vitals:    12/05/18 1131   BP: 117/85   Pulse: 79         PROCEDURE NOTE - Botulinum Toxin Injection    Procedure Date: 12/06/2018  Procedure: Botulinum Toxin Injections   Indication/ Diagnosis:   1. Spasticity  CHEMODENERVATION       Prior to beginning the procedure, the risks (pain, bleeding, infection, muscle weakness, allergic reaction, dysphagia/ dyspnea) and benefits (spasticity reduction, improve positioning, improved brace tolerance) were explained and the patient's mother verbally consented to botulinum toxin injections of the left upper limb and bilateral lower limbs .   A written consent form was also completed and signed by the patient's mother.    Medication: [id number]  Lot Number: [id number]  Expiration Date: 04/2020  Total Amount Reconstituted: 500 Units  Dilution: 1:2    The botulinum toxin was reconstituted in 10 mL of preservative free saline (Lot # [id number], Exp 07/21) in a 100 Units to 2 mL ratio.  A time out was performed to locate and confirm the correct limb for injection.  The skin was prepped with alcohol prior to each injection.  Audible EMG guidance was used to localize the desired muscles.  The below listed amounts of botulinum toxin were injected into the below listed muscles via a 37 mm x 27 gauge needle electrode (unless otherwise specified) with aspiration prior to each injection.  The procedure was well tolerated with minimal bleeding that resolved after brief pressure was applied.    Muscles Injected:    ???  Right lower limb  Right rectus femoris 50 units  Right medial gastrocnemius 33.3 units  Right lateral gastrocnemius 33.3 units  Right soleus 33.3 units  Right posterior tibialis 50 units  ???  Left upper limb  Left biceps brachii 50 units  Left brachioradialis 50 Units  ???  Left lower limb  Left rectus femoris 50 units  Left medial gastrocnemius 33.3 units  Left lateral gastrocnemius 33.3 units  Left soleus 33.3 units  Left posterior tibialis 50 units    Total Dose: 500 Units, no botulinum toxin was wasted      PLAN:   -The patient will be eligible for repeat botulinum toxin injection in 3 months if clinically indicated.  -Continue home positioning, stretching, splinting, and therapy program.      Shea [name], M.D.
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
## 2018-12-10 ENCOUNTER — Encounter: Admit: 2018-12-10 | Discharge: 2018-12-10

## 2018-12-10 DIAGNOSIS — S069X9A Unspecified intracranial injury with loss of consciousness of unspecified duration, initial encounter: Secondary | ICD-10-CM

## 2018-12-10 DIAGNOSIS — I2699 Other pulmonary embolism without acute cor pulmonale: Secondary | ICD-10-CM

## 2018-12-10 DIAGNOSIS — I639 Cerebral infarction, unspecified: Secondary | ICD-10-CM

## 2018-12-10 NOTE — Telephone Encounter
Left message for patient's mother to please call back at her earliest convenience to schedule Shalondra's one year follow up appointment with Dr. Cassell Smiles.

## 2018-12-10 NOTE — Telephone Encounter
Patient's mom called to schedule one year f/u.  Appt scheduled with Dr. Cassell Smiles on 8/10 at 9;15am at [facility].    We will schedule patient's video swallow after she come in for follow up.    No further questions at this time.

## 2018-12-10 NOTE — Telephone Encounter
Calling to scheudle video swallow / appt for 1 year

## 2018-12-10 NOTE — Telephone Encounter
-----   Message from Angelica Pou, MD sent at 12/10/2018  8:00 AM CDT -----  Regarding: Follow up  Will need a one year follow up to check her swallow progress.  Likely we will order a repeat MBSS after her follow up.

## 2019-01-16 ENCOUNTER — Encounter: Admit: 2019-01-16 | Discharge: 2019-01-16 | Payer: Commercial Managed Care - PPO

## 2019-02-12 ENCOUNTER — Encounter: Admit: 2019-02-12 | Discharge: 2019-02-12 | Payer: Commercial Managed Care - PPO

## 2019-02-12 DIAGNOSIS — R252 Cramp and spasm: Secondary | ICD-10-CM

## 2019-02-12 MED ORDER — INCOBOTULINUMTOXINA 100 UNIT IM SOLR
500 [IU] | Freq: Once | INTRAMUSCULAR | 0 refills | Status: CP
Start: 2019-02-12 — End: ?

## 2019-02-19 ENCOUNTER — Encounter: Admit: 2019-02-19 | Discharge: 2019-02-19 | Payer: Commercial Managed Care - PPO

## 2019-02-19 NOTE — Telephone Encounter
Returned call to Silt, PT at [facility] that patient will have to be cleared from ortho in order to determine WB status to participate in tilt table exercises.

## 2019-03-02 ENCOUNTER — Encounter: Admit: 2019-03-02 | Discharge: 2019-03-02 | Payer: Commercial Managed Care - PPO

## 2019-03-06 ENCOUNTER — Encounter: Admit: 2019-03-06 | Discharge: 2019-03-06 | Payer: Commercial Managed Care - PPO

## 2019-03-06 ENCOUNTER — Ambulatory Visit: Admit: 2019-03-06 | Discharge: 2019-03-06 | Payer: MEDICARE

## 2019-03-06 DIAGNOSIS — R252 Cramp and spasm: Secondary | ICD-10-CM

## 2019-03-06 DIAGNOSIS — S069X9A Unspecified intracranial injury with loss of consciousness of unspecified duration, initial encounter: Secondary | ICD-10-CM

## 2019-03-06 DIAGNOSIS — M25361 Other instability, right knee: Secondary | ICD-10-CM

## 2019-03-06 DIAGNOSIS — I2699 Other pulmonary embolism without acute cor pulmonale: Secondary | ICD-10-CM

## 2019-03-06 DIAGNOSIS — I639 Cerebral infarction, unspecified: Secondary | ICD-10-CM

## 2019-03-06 MED ORDER — INCOBOTULINUMTOXINA 100 UNIT IM SOLR
500 [IU] | Freq: Once | INTRAMUSCULAR | 0 refills | Status: AC
Start: 2019-03-06 — End: ?

## 2019-03-14 ENCOUNTER — Encounter: Admit: 2019-03-14 | Discharge: 2019-03-14 | Payer: Commercial Managed Care - PPO

## 2019-04-03 ENCOUNTER — Encounter: Admit: 2019-04-03 | Discharge: 2019-04-03 | Payer: Commercial Managed Care - PPO

## 2019-04-03 DIAGNOSIS — M25361 Other instability, right knee: Secondary | ICD-10-CM

## 2019-04-04 ENCOUNTER — Ambulatory Visit: Admit: 2019-04-04 | Discharge: 2019-04-04 | Payer: Commercial Managed Care - PPO

## 2019-04-04 ENCOUNTER — Encounter: Admit: 2019-04-04 | Discharge: 2019-04-04 | Payer: Commercial Managed Care - PPO

## 2019-04-04 ENCOUNTER — Ambulatory Visit: Admit: 2019-04-04 | Discharge: 2019-04-04 | Payer: MEDICARE

## 2019-04-04 DIAGNOSIS — M24572 Contracture, left ankle: Secondary | ICD-10-CM

## 2019-04-04 DIAGNOSIS — M25361 Other instability, right knee: Secondary | ICD-10-CM

## 2019-04-04 DIAGNOSIS — I2699 Other pulmonary embolism without acute cor pulmonale: Secondary | ICD-10-CM

## 2019-04-04 DIAGNOSIS — S069X9A Unspecified intracranial injury with loss of consciousness of unspecified duration, initial encounter: Secondary | ICD-10-CM

## 2019-04-04 DIAGNOSIS — M24571 Contracture, right ankle: Secondary | ICD-10-CM

## 2019-04-04 DIAGNOSIS — B999 Unspecified infectious disease: Secondary | ICD-10-CM

## 2019-04-04 DIAGNOSIS — I639 Cerebral infarction, unspecified: Secondary | ICD-10-CM
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
## 2019-04-15 ENCOUNTER — Ambulatory Visit: Admit: 2019-04-15 | Discharge: 2019-04-16 | Payer: MEDICARE

## 2019-06-03 ENCOUNTER — Encounter: Admit: 2019-06-03 | Discharge: 2019-06-03 | Payer: Commercial Managed Care - PPO

## 2019-06-03 ENCOUNTER — Ambulatory Visit: Admit: 2019-06-03 | Discharge: 2019-06-04 | Payer: MEDICARE

## 2019-06-03 DIAGNOSIS — I2699 Other pulmonary embolism without acute cor pulmonale: Secondary | ICD-10-CM

## 2019-06-03 DIAGNOSIS — B999 Unspecified infectious disease: Secondary | ICD-10-CM

## 2019-06-03 DIAGNOSIS — G5131 Clonic hemifacial spasm, right: Secondary | ICD-10-CM

## 2019-06-03 DIAGNOSIS — S069X9A Unspecified intracranial injury with loss of consciousness of unspecified duration, initial encounter: Secondary | ICD-10-CM

## 2019-06-03 DIAGNOSIS — G40209 Localization-related (focal) (partial) symptomatic epilepsy and epileptic syndromes with complex partial seizures, not intractable, without status epilepticus: Secondary | ICD-10-CM

## 2019-06-03 DIAGNOSIS — I639 Cerebral infarction, unspecified: Secondary | ICD-10-CM

## 2019-06-03 MED ORDER — LEVETIRACETAM 100 MG/ML PO SOLN
500 mg | Freq: Two times a day (BID) | ORAL | 11 refills | 90.00000 days | Status: DC
Start: 2019-06-03 — End: 2019-12-02

## 2019-06-03 MED ORDER — GABAPENTIN 250 MG/5 ML (5 ML) PO SOLN
3 mL | Freq: Three times a day (TID) | ORAL | 11 refills | Status: DC
Start: 2019-06-03 — End: 2019-12-02

## 2019-06-03 NOTE — Progress Notes
Date of Service: 06/03/2019    Subjective:             Sherri [name] is a 24 y.o. female.    History of Present Illness  She had pneumonia in December. She also had an eye infection at the time and was put on acyclovir and erythromycin.     Mom has not noticed any seizures. She is doing well on medications.     Facial twitching has improved. It comes and goes but is overall better.     She has quite a bit of fatigue but when she is awake she is doing well. Continues to make progress in physical therapy.     No problems with medications.     Otherwise There has been no significant change to her past medical, surgical, or social history       Review of Systems      Objective:         ? acetaminophen SR(+) (TYLENOL) 650 mg tablet Take 650 mg by mouth every 6 hours as needed for Pain.   ? baclofen (LIORESAL) 20 mg tablet Take 20 mg by mouth three times daily.   ? bisacodyl (DULCOLAX) 10 mg rectal suppository Insert or Apply 10 mg to rectal area as directed as Needed.   ? CALCIUM PO Take 600 mg by mouth. Indications: with Vitamin D   ? cefdinir (OMNICEF) 300 mg capsule Take 300 mg by mouth every 12 hours.   ? chlorhexidine gluconate (PERIDEX) 0.12 % solution Swish and Spit 15 mL by mouth as directed twice daily.   ? cloNIDine (CATAPRESS) 0.1 mg tablet Take 0.5 mg by mouth 1 & 3 hours after meals and at bedtime as needed.   ? Cyanocobalamin 1,000 mcg/15 mL liqd Take 1,000 mcg by mouth daily.   ? DIAZEPAM PO Take 2.5 mg by mouth every 6 hours as needed.   ? docosahexanoic acid/epa (FISH OIL PO) Take 1,000 Units by mouth daily.   ? esomeprazole DR(+) (NEXIUM) 20 mg capsule Take 20 mg by mouth daily as needed. Take on an empty stomach at least 1 hour before or 2 hours after food.    ? gabapentin 250 mg/5 mL (5 mL) soln Take 300 mg by mouth three times daily. (Patient taking differently: Take 50 mg by mouth three times daily. 3ml) ? guaiFENesin (ROBITUSSIN) 100 mg/5 mL oral solution Take 30 mL by mouth every 4 hours as needed.   ? hydrocortisone 1 % topical cream Apply  topically to affected area three times daily as needed.   ? Lactobacillus acidophilus (ACIDOPHILUS PO) Take  by mouth twice daily.   ? levalbuterol(+) (XOPENEX) 1.25 mg/3 mL nebulizer solution Inhale 1.25 mg solution by nebulizer as directed every 4 hours as needed for Wheezing.   ? levETIRAcetam (KEPPRA) 100 mg/mL oral solution Take 5 mL by mouth twice daily.   ? medroxyPROGESTERone (contraceptive) (DEPO-PROVERA) syringe Inject 150 mg into the muscle every 90 days.   ? melatonin 3 mg tab Take 3 mg by mouth at bedtime daily.   ? mupirocin (BACTROBAN) 2 % topical ointment Apply  topically to affected area twice daily.   ? nystatin (NYSTOP) 100,000 unit/g topical powder Apply  topically to affected area four times daily.   ? oxyCODONE (ROXICODONE) 1 mg/mL oral solution Take 5-15 mL by mouth every 4 hours as needed for Pain Earliest Fill Date: 04/06/16 GIVE VIA PEG   ? polyethylene glycol 3350 (MIRALAX) 17 g packet Take 1 packet by  mouth daily. GIVE VIA PEG   ? polyvinyl alcohol (LIQUIFILM TEARS) 1.4 % ophthalmic solution Place 1 drop into or around eye(s) daily.   ? ranitidine(+) (ZANTAC) 15 mg/mL oral syrup Take 75 mg by mouth daily as needed.   ? senna/docusate (SENOKOT-S) 8.8/50 mg /10 mL solution 10 mL by PEG Tube route twice daily.   ? sertraline (ZOLOFT) 25 mg tablet Take 25 mg by mouth daily.   ? sertraline (ZOLOFT) 25 mg tablet Take 50 mg by mouth at bedtime daily.   ? simethicone (MYLICON) 80 mg chew tablet Chew 80 mg by mouth every 6 hours as needed for Flatulence.   ? sodium chloride (SEA MIST) 0.65 % nasal spray Apply 1-2 sprays to each nostril as directed three times daily as needed.   ? zinc oxide 20 % topical ointment Apply  topically to affected area as Needed (Every 2 hours as needed).     Vitals:    06/03/19 0938   BP: 116/70   Pulse: 103 There is no height or weight on file to calculate BMI.     Physical Exam  Patient is awake.   Will follow simple commands.   EOEMI- right gaze preference.   occasional facial spasms noted on the right.   Tone increased in Bil UE right more than left.          Assessment and Plan:          Partial symptomatic epilepsy with complex partial seizures, not intractable, without status epilepticus (HCC)  No seizures. Last known seizure was in July 2019.     Will continue levetiracetam 500 mg BID and gabapentin 3 mls TID (also used for hemifacial spasm).         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

## 2019-06-03 NOTE — Assessment & Plan Note
No seizures. Last known seizure was in July 2019.     Will continue levetiracetam 500 mg BID and gabapentin 3 mls TID (also used for hemifacial spasm).

## 2019-06-03 NOTE — Assessment & Plan Note
improved on gabapentin- will continue.

## 2019-06-17 ENCOUNTER — Encounter: Admit: 2019-06-17 | Discharge: 2019-06-17 | Payer: Commercial Managed Care - PPO

## 2019-06-17 ENCOUNTER — Ambulatory Visit: Admit: 2019-06-17 | Discharge: 2019-06-17 | Payer: MEDICARE

## 2019-06-17 DIAGNOSIS — I2699 Other pulmonary embolism without acute cor pulmonale: Secondary | ICD-10-CM

## 2019-06-17 DIAGNOSIS — R252 Cramp and spasm: Secondary | ICD-10-CM

## 2019-06-17 DIAGNOSIS — I639 Cerebral infarction, unspecified: Secondary | ICD-10-CM

## 2019-06-17 DIAGNOSIS — S069X9A Unspecified intracranial injury with loss of consciousness of unspecified duration, initial encounter: Secondary | ICD-10-CM

## 2019-06-17 DIAGNOSIS — B999 Unspecified infectious disease: Secondary | ICD-10-CM

## 2019-06-17 MED ORDER — INCOBOTULINUMTOXINA 100 UNIT IM SOLR
500 [IU] | Freq: Once | INTRAMUSCULAR | 0 refills | Status: CP
Start: 2019-06-17 — End: ?
  Administered 2019-06-17: 21:00:00 500 [IU] via INTRAMUSCULAR

## 2019-06-17 NOTE — Patient Instructions
Call Lakeva Hollon at [phone] with any questions or concerns

## 2019-08-14 ENCOUNTER — Encounter: Admit: 2019-08-14 | Discharge: 2019-08-14 | Payer: Commercial Managed Care - PPO

## 2019-09-16 ENCOUNTER — Encounter: Admit: 2019-09-16 | Discharge: 2019-09-16 | Payer: MEDICARE

## 2019-09-16 ENCOUNTER — Ambulatory Visit: Admit: 2019-09-16 | Discharge: 2019-09-16 | Payer: MEDICARE

## 2019-09-16 DIAGNOSIS — G8250 Quadriplegia, unspecified: Secondary | ICD-10-CM

## 2019-09-16 DIAGNOSIS — R252 Cramp and spasm: Secondary | ICD-10-CM

## 2019-09-16 MED ADMIN — INCOBOTULINUMTOXINA 100 UNIT IM SOLR [304288]: 500 [IU] | INTRAMUSCULAR | @ 22:00:00 | Stop: 2019-09-16 | NDC 00259161001

## 2019-09-17 ENCOUNTER — Encounter: Admit: 2019-09-17 | Discharge: 2019-09-17 | Payer: MEDICARE

## 2019-09-17 NOTE — Progress Notes
Faxed referral to [facility] [phone]) for AFO brace. Fax confirmation received. Referral mailed to patient's mom.

## 2019-11-12 ENCOUNTER — Encounter: Admit: 2019-11-12 | Discharge: 2019-11-12 | Payer: MEDICARE

## 2019-11-13 ENCOUNTER — Encounter: Admit: 2019-11-13 | Discharge: 2019-11-13 | Payer: MEDICARE

## 2019-11-13 ENCOUNTER — Ambulatory Visit: Admit: 2019-11-13 | Discharge: 2019-11-14 | Payer: MEDICARE

## 2019-11-13 DIAGNOSIS — I2699 Other pulmonary embolism without acute cor pulmonale: Secondary | ICD-10-CM

## 2019-11-13 DIAGNOSIS — S069X9A Unspecified intracranial injury with loss of consciousness of unspecified duration, initial encounter: Secondary | ICD-10-CM

## 2019-11-13 DIAGNOSIS — R252 Cramp and spasm: Secondary | ICD-10-CM

## 2019-11-13 DIAGNOSIS — B999 Unspecified infectious disease: Secondary | ICD-10-CM

## 2019-11-13 DIAGNOSIS — I639 Cerebral infarction, unspecified: Secondary | ICD-10-CM

## 2019-11-13 MED ORDER — INCOBOTULINUMTOXINA 100 UNIT IM SOLR
500 [IU] | Freq: Once | INTRAMUSCULAR | 0 refills | Status: AC
Start: 2019-11-13 — End: ?

## 2019-11-25 ENCOUNTER — Encounter: Admit: 2019-11-25 | Discharge: 2019-11-25 | Payer: MEDICARE

## 2019-12-02 ENCOUNTER — Ambulatory Visit: Admit: 2019-12-02 | Discharge: 2019-12-03 | Payer: MEDICARE

## 2019-12-02 ENCOUNTER — Encounter: Admit: 2019-12-02 | Discharge: 2019-12-02 | Payer: MEDICARE

## 2019-12-02 DIAGNOSIS — G40209 Localization-related (focal) (partial) symptomatic epilepsy and epileptic syndromes with complex partial seizures, not intractable, without status epilepticus: Secondary | ICD-10-CM

## 2019-12-02 DIAGNOSIS — G5131 Clonic hemifacial spasm, right: Secondary | ICD-10-CM

## 2019-12-02 DIAGNOSIS — I2699 Other pulmonary embolism without acute cor pulmonale: Secondary | ICD-10-CM

## 2019-12-02 DIAGNOSIS — S069X9A Unspecified intracranial injury with loss of consciousness of unspecified duration, initial encounter: Secondary | ICD-10-CM

## 2019-12-02 DIAGNOSIS — I639 Cerebral infarction, unspecified: Secondary | ICD-10-CM

## 2019-12-02 DIAGNOSIS — B999 Unspecified infectious disease: Secondary | ICD-10-CM

## 2019-12-02 MED ORDER — GABAPENTIN 250 MG/5 ML (5 ML) PO SOLN
3 mL | Freq: Three times a day (TID) | ORAL | 11 refills | Status: AC
Start: 2019-12-02 — End: ?

## 2019-12-02 MED ORDER — LEVETIRACETAM 100 MG/ML PO SOLN
500 mg | Freq: Two times a day (BID) | ORAL | 11 refills | 90.00000 days | Status: AC
Start: 2019-12-02 — End: ?

## 2019-12-02 NOTE — Assessment & Plan Note
Improved. Continue gabapentin 250 mg TID.

## 2019-12-02 NOTE — Progress Notes
Obtained patient's verbal consent to treat them and their agreement to [facility] financial policy and NPP via this telehealth visit during the [facility] Emergency  Visit done via Zoom.      Date of Service: 12/02/2019    Subjective:             Sherri [name] is a 24 y.o. female.    History of Present Illness  Patient seen via Zoom with her mother as patient is not verbal.   Facial twitching has improved.   She had therapy this morning which wears her out. continues to have improvement in with therapy. Getting some movements in right arm and right leg.   No seizures.   Energy level varies from day to day. Sertraline has been increased to 50 mg a day which seems to have helped.   She has been getting out of the house more. She has been going to bible study and church.   She is working on a TOBI device with eye gaze.     Vital signs  T: 97.3  BP: 127/78  Pulse:65     Review of Systems      Objective:         ? acetaminophen SR(+) (TYLENOL) 650 mg tablet Take 650 mg by mouth every 6 hours as needed for Pain.   ? baclofen (LIORESAL) 20 mg tablet Take 20 mg by mouth three times daily.   ? bisacodyl (DULCOLAX) 10 mg rectal suppository Insert or Apply 10 mg to rectal area as directed as Needed.   ? CALCIUM PO Take 600 mg by mouth. Indications: with Vitamin D   ? chlorhexidine gluconate (PERIDEX) 0.12 % solution Swish and Spit 15 mL by mouth as directed twice daily.   ? cloNIDine (CATAPRESS) 0.1 mg tablet Take 0.1 mg by mouth as Needed (1 & 3 hours after meals).   ? Cyanocobalamin 1,000 mcg/15 mL liqd Take 1,000 mcg by mouth daily.   ? DIAZEPAM PO Take 2.5 mg by mouth every 6 hours as needed.   ? docosahexanoic acid/epa (FISH OIL PO) Take 1,000 Units by mouth daily.   ? gabapentin 250 mg/5 mL (5 mL) soln Take 3 mL by mouth three times daily.   ? hydrocortisone 1 % topical cream Apply  topically to affected area three times daily as needed.   ? Lactobacillus acidophilus (ACIDOPHILUS PO) Take  by mouth twice daily. ? levalbuterol(+) (XOPENEX) 1.25 mg/3 mL nebulizer solution Inhale 1.25 mg solution by nebulizer as directed every 4 hours as needed for Wheezing.   ? levETIRAcetam (KEPPRA) 100 mg/mL oral solution Take 5 mL by mouth twice daily.   ? medroxyPROGESTERone (contraceptive) (DEPO-PROVERA) syringe Inject 150 mg into the muscle every 90 days.   ? melatonin 3 mg tab Take 3 mg by mouth as Needed (bedtime).   ? mupirocin (BACTROBAN) 2 % topical ointment Apply  topically to affected area twice daily.   ? nystatin (NYSTOP) 100,000 unit/g topical powder Apply  topically to affected area four times daily.   ? oxyCODONE (ROXICODONE) 5 mg tablet Take 5-15 mg by mouth every 4 hours as needed for Pain   ? polyethylene glycol 3350 (MIRALAX) 17 g packet Take 1 packet by mouth daily. GIVE VIA PEG   ? polyvinyl alcohol (LIQUIFILM TEARS) 1.4 % ophthalmic solution Place 1 drop into or around eye(s) daily.   ? senna/docusate (SENOKOT-S) 8.8/50 mg /10 mL solution 10 mL by PEG Tube route twice daily. (Patient taking differently: 10 mL  by PEG Tube route as Needed.)   ? sertraline (ZOLOFT) 50 mg tablet Take 50 mg by mouth at bedtime daily.   ? simethicone (MYLICON) 80 mg chew tablet Chew 80 mg by mouth every 6 hours as needed for Flatulence.   ? sodium chloride (SEA MIST) 0.65 % nasal spray Apply 1-2 sprays to each nostril as directed three times daily as needed.   ? zinc oxide 20 % topical ointment Apply  topically to affected area as Needed (Every 2 hours as needed).     There were no vitals filed for this visit.  There is no height or weight on file to calculate BMI.     Physical Exam    Patient is awake and alert.   Gives a thumbs up  When asked how she is doing.                                         Assessment and Plan:          Partial symptomatic epilepsy with complex partial seizures, not intractable, without status epilepticus (HCC)  Doing well. Last seizure was in 2019.   Continue levetiracetam 500 mg BID      Hemifacial spasm Improved. Continue gabapentin 250 mg TID.

## 2019-12-02 NOTE — Assessment & Plan Note
Doing well. Last seizure was in 2019.   Continue levetiracetam 500 mg BID

## 2019-12-10 ENCOUNTER — Encounter: Admit: 2019-12-10 | Discharge: 2019-12-10 | Payer: MEDICARE

## 2019-12-10 ENCOUNTER — Ambulatory Visit: Admit: 2019-12-10 | Discharge: 2019-12-10 | Payer: MEDICARE

## 2019-12-10 DIAGNOSIS — I639 Cerebral infarction, unspecified: Secondary | ICD-10-CM

## 2019-12-10 DIAGNOSIS — Z9889 Other specified postprocedural states: Secondary | ICD-10-CM

## 2019-12-10 DIAGNOSIS — I2699 Other pulmonary embolism without acute cor pulmonale: Secondary | ICD-10-CM

## 2019-12-10 DIAGNOSIS — R1312 Dysphagia, oropharyngeal phase: Secondary | ICD-10-CM

## 2019-12-10 DIAGNOSIS — S069X6D Unspecified intracranial injury with loss of consciousness greater than 24 hours without return to pre-existing conscious level with patient surviving, subsequent encounter: Secondary | ICD-10-CM

## 2019-12-10 DIAGNOSIS — S069X9A Unspecified intracranial injury with loss of consciousness of unspecified duration, initial encounter: Secondary | ICD-10-CM

## 2019-12-10 DIAGNOSIS — B999 Unspecified infectious disease: Secondary | ICD-10-CM

## 2019-12-10 DIAGNOSIS — J9504 Tracheo-esophageal fistula following tracheostomy: Secondary | ICD-10-CM

## 2019-12-16 ENCOUNTER — Encounter: Admit: 2019-12-16 | Discharge: 2019-12-16 | Payer: MEDICARE

## 2019-12-16 ENCOUNTER — Ambulatory Visit: Admit: 2019-12-16 | Discharge: 2019-12-17 | Payer: MEDICARE

## 2019-12-16 DIAGNOSIS — R252 Cramp and spasm: Principal | ICD-10-CM

## 2019-12-16 DIAGNOSIS — I639 Cerebral infarction, unspecified: Secondary | ICD-10-CM

## 2019-12-16 DIAGNOSIS — B999 Unspecified infectious disease: Secondary | ICD-10-CM

## 2019-12-16 DIAGNOSIS — I2699 Other pulmonary embolism without acute cor pulmonale: Secondary | ICD-10-CM

## 2019-12-16 DIAGNOSIS — S069X9A Unspecified intracranial injury with loss of consciousness of unspecified duration, initial encounter: Secondary | ICD-10-CM

## 2019-12-16 MED ORDER — INCOBOTULINUMTOXINA 100 UNIT IM SOLR
400 [IU] | Freq: Once | INTRAMUSCULAR | 0 refills | Status: CP
Start: 2019-12-16 — End: ?

## 2019-12-16 NOTE — Procedures
Neurorehabilitation Medicine - Procedure Visit  Physical Medicine & Rehabilitation  [facility]    Date of Service: 12/16/2019    Date of last botulinum toxin injection (any body site): 09/16/2019  Complications or side effects with last injections: No  Current or recent fevers, chills, cough, difficulty breathing: No  Currently taking antibiotics: No  Currently taking any anti-coagulant medications: No  Other spasticity medications: Yes, oral baclofen    Brief History/ Exam: 24 y.o. 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  Mother provides her history and endorses good response to botulinum toxin injections with improved lower limb ROM including at the knees and ankles.  No dosing adjustments requested for today's injections.  Her AFOs are being actively adjusted as her ankle ROM improves.  No side effects with previous injections reported.  Exam shows stable spastic tetraparesis .    Vitals:    12/16/19 1413   BP: 108/75   BP Source: Arm, Right Upper   Patient Position: Sitting   Pulse: 87   PainSc: Zero       PROCEDURE NOTE - Botulinum Toxin Injection    Procedure Date: 12/16/2019  Procedure: Botulinum Toxin Injections   Indication/ Diagnosis:   1. Spasticity  CHEMODENERVATION EXTREMITY MUSCLES     Prior to beginning the procedure, the risks (pain, bleeding, infection, muscle weakness, allergic reaction, dysphagia/ dyspnea) and benefits (spasticity reduction, improved positioning/ ROM, decreased burden of care) were explained and the patient and/or parents consented to botulinum toxin injections of the right upper limb and bilateral lower limbs but showing.   A written consent form was also completed and signed by the patient's mother.    Medication: incobotulinumtoxinA  Lot Number: [id number]  Expiration Date: 04/2021  Total Amount Reconstituted: 400 Units  Dilution: 1:4    The botulinum toxin was reconstituted in 16 mL of preservative free saline (Lot # [id number], Exp 11/2020) in a 100 Units to 4 mL ratio.  A time out was performed to locate and confirm the correct limb for injection.  The skin was prepped with alcohol prior to each injection.  Audible EMG guidance was used to localize the desired muscles.  The below listed amounts of botulinum toxin were injected into the below listed muscles via a 37 mm x 27 gauge needle electrode (unless otherwise specified) with aspiration prior to each injection.  The procedure was well tolerated with minimal bleeding that resolved after brief pressure was applied.    Muscles Injected:      Right Upper Limb  Right Triceps 25 Units  Right Flexor Carpi Radialis 25 Units  Right Flexor Digitorum Superficialis 25 Units  Right Flexor Digitorum Profundus 25 Units  ?  Right lower limb  Right medial gastrocnemius 33.3 units  Right lateral gastrocnemius 33.3 units  Right soleus 33.3 units  Right posterior tibialis 50 units  ?  Left lower limb  Left medial gastrocnemius 33.3 units  Left lateral gastrocnemius 33.3 units  Left soleus 33.3 units  Left posterior tibialis 50 units    Total Dose: 400 Units, no botulinum toxin was wasted    Administrations This Visit     INCObotulinum toxin A (XEOMIN) injection 400 Units     Admin Date  12/16/2019 Action  Given Dose  400 Units Route  Intramuscular Administered By  Su Monks, MD                  PLAN:   -The patient will be eligible for  repeat botulinum toxin injection in 3 months if clinically indicated.  -Follow-up in this clinic in 4-8 weeks to assess efficacy of the above injections (ok via tele-health).  -Continue home splinting, stretching, positioning, and therapy program.      Shea [name], M.D.

## 2020-01-28 ENCOUNTER — Ambulatory Visit: Admit: 2020-01-28 | Discharge: 2020-01-28 | Payer: MEDICARE

## 2020-01-28 ENCOUNTER — Encounter: Admit: 2020-01-28 | Discharge: 2020-01-28 | Payer: MEDICARE

## 2020-01-28 DIAGNOSIS — I499 Cardiac arrhythmia, unspecified: Secondary | ICD-10-CM

## 2020-01-29 ENCOUNTER — Encounter: Admit: 2020-01-29 | Discharge: 2020-01-29 | Payer: MEDICARE

## 2020-01-29 ENCOUNTER — Ambulatory Visit: Admit: 2020-01-29 | Discharge: 2020-01-29 | Payer: MEDICARE

## 2020-01-29 DIAGNOSIS — S069X5A Unspecified intracranial injury with loss of consciousness greater than 24 hours with return to pre-existing conscious level, initial encounter: Secondary | ICD-10-CM

## 2020-01-29 DIAGNOSIS — R1310 Dysphagia, unspecified: Secondary | ICD-10-CM

## 2020-01-29 DIAGNOSIS — I499 Cardiac arrhythmia, unspecified: Secondary | ICD-10-CM

## 2020-02-24 ENCOUNTER — Encounter: Admit: 2020-02-24 | Discharge: 2020-02-24 | Payer: Commercial Managed Care - PPO

## 2020-02-25 ENCOUNTER — Encounter: Admit: 2020-02-25 | Discharge: 2020-02-25 | Payer: Commercial Managed Care - PPO

## 2020-03-02 ENCOUNTER — Ambulatory Visit: Admit: 2020-03-02 | Discharge: 2020-03-03 | Payer: Commercial Managed Care - PPO

## 2020-03-02 ENCOUNTER — Encounter: Admit: 2020-03-02 | Discharge: 2020-03-02 | Payer: Commercial Managed Care - PPO

## 2020-03-02 DIAGNOSIS — I639 Cerebral infarction, unspecified: Secondary | ICD-10-CM

## 2020-03-02 DIAGNOSIS — S069X9S Unspecified intracranial injury with loss of consciousness of unspecified duration, sequela: Secondary | ICD-10-CM

## 2020-03-02 DIAGNOSIS — S069X9A Unspecified intracranial injury with loss of consciousness of unspecified duration, initial encounter: Secondary | ICD-10-CM

## 2020-03-02 DIAGNOSIS — I2699 Other pulmonary embolism without acute cor pulmonale: Secondary | ICD-10-CM

## 2020-03-02 DIAGNOSIS — B999 Unspecified infectious disease: Secondary | ICD-10-CM

## 2020-03-02 MED ORDER — INCOBOTULINUMTOXINA 100 UNIT IM SOLR
400 [IU] | Freq: Once | INTRAMUSCULAR | 0 refills | Status: AC
Start: 2020-03-02 — End: ?
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
## 2020-03-03 DIAGNOSIS — R252 Cramp and spasm: Secondary | ICD-10-CM

## 2020-03-18 ENCOUNTER — Encounter: Admit: 2020-03-18 | Discharge: 2020-03-18 | Payer: Commercial Managed Care - PPO

## 2020-03-18 ENCOUNTER — Ambulatory Visit: Admit: 2020-03-18 | Discharge: 2020-03-18 | Payer: MEDICARE

## 2020-03-18 DIAGNOSIS — S069X9S Unspecified intracranial injury with loss of consciousness of unspecified duration, sequela: Secondary | ICD-10-CM

## 2020-03-18 DIAGNOSIS — B999 Unspecified infectious disease: Secondary | ICD-10-CM

## 2020-03-18 DIAGNOSIS — R252 Cramp and spasm: Secondary | ICD-10-CM

## 2020-03-18 DIAGNOSIS — I2699 Other pulmonary embolism without acute cor pulmonale: Secondary | ICD-10-CM

## 2020-03-18 DIAGNOSIS — S069X9A Unspecified intracranial injury with loss of consciousness of unspecified duration, initial encounter: Secondary | ICD-10-CM

## 2020-03-18 DIAGNOSIS — I639 Cerebral infarction, unspecified: Secondary | ICD-10-CM

## 2020-03-18 NOTE — Procedures
Neurorehabilitation Medicine - Procedure Visit  Physical Medicine & Rehabilitation  [facility]    Date of Service: 03/18/2020    Date of last botulinum toxin injection (any body site): 12/16/2019  Complications or side effects with last injections: No  Current or recent fevers, chills, cough, difficulty breathing: No  Currently taking antibiotics: No  Currently taking any anti-coagulant medications: No  Other spasticity medications: Yes, oral baclofen    Brief History/ Exam: 24 y.o. 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  No side effects from previous injections.  No significant changes requested to previous regimen.  Patient with intermittent coughing and ongoing poor oral secretion control requiring oral suction during clinic today.  Patient's mother has been monitoring respiratory status and following up with PCP for recent hospitalization for pulmonary edema.  Exam stable with most equinovarus positioning noted at the left ankle.    Vitals:    03/18/20 1414   BP: (!) 147/88   BP Source: Arm, Left Upper   Patient Position: Sitting   Pulse: 68   Weight: 98 kg (216 lb)   Height: 172.7 cm (68)       PROCEDURE NOTE - Botulinum Toxin Injection    Procedure Date: 03/18/2020  Procedure: Botulinum Toxin Injections   Indication/ Diagnosis:   1. Spasticity  CHEMODENERVATION EXTREMITY MUSCLES     Prior to beginning the procedure, the risks (pain, bleeding, infection, muscle weakness, allergic reaction, dysphagia/ dyspnea) and benefits (spasticity reduction, improved positioning/ ROM, decreased burden of care) were explained and the patient and/or parents consented to botulinum toxin injections of the right upper limb and bilateral lower limbs but showing.   A written consent form was also completed and signed by the patient's mother.    Medication: incobotulinumtoxinA  Lot Number: [id number]  Expiration Date: 07/2020  Total Amount Reconstituted: 400 Units  Dilution: 1:4    The botulinum toxin was reconstituted in 16 mL of preservative free saline (Lot # [id number] Exp 06/2021) in a 100 Units to 4 mL ratio.  A time out was performed to locate and confirm the correct limb for injection.  The skin was prepped with alcohol prior to each injection.  Audible EMG guidance was used to localize the desired muscles.  The below listed amounts of botulinum toxin were injected into the below listed muscles via a 37 mm x 27 gauge needle electrode (unless otherwise specified) with aspiration prior to each injection.  The procedure was well tolerated with minimal bleeding that resolved after brief pressure was applied.    Muscles Injected:      Right Upper Limb  Right Triceps 25 Units  Right Flexor Carpi Radialis 25 Units  Right Flexor Digitorum Superficialis 25 Units  Right Flexor Digitorum Profundus 25 Units  ?  Right lower limb  Right medial gastrocnemius 50 units  Right lateral gastrocnemius 50 units  Right soleus 50 units  ?  Left lower limb  Left medial gastrocnemius 33.3 units  Left lateral gastrocnemius 33.3 units  Left soleus 33.3 units  Left posterior tibialis 50 units    Total Dose: 400 Units, no botulinum toxin was wasted    Administrations This Visit     INCObotulinum toxin A (XEOMIN) injection 400 Units     Admin Date  03/18/2020 Action  Given Dose  400 Units Route  Intramuscular Administered By  Su Monks, MD                PLAN:   -  The patient will be eligible for repeat botulinum toxin injection in 3 months if clinically indicated.  -Follow-up in this clinic in 4-8 weeks to assess efficacy of the above injections (ok via tele-health).  -Continue home splinting, stretching, positioning, and therapy program.    Recommended post-traumatic pituitary screen  Will order screening endocrine labs for post-traumatic hypopituitarism including (mother prefers to coordinate with PCP):   -Serum cortisol (morning)   -TSH, free T4   -IGF-1 (morning)   -prolactin   -FSH, LH   -estradiol (E2) (women)    Orders Placed This Encounter   ? FOLLICLE STIMULATING HORMONE   ? LUTEINIZING HORMONE   ? PROLACTIN   ? CORTISOL-AM   ? SOMATOMEDIN C, IGF-1   ? THYROID STIMULATING HORMONE-TSH   ? TOTAL THYROXINE T4   ? ESTRADIOL (E2)       Su Monks, MD

## 2020-03-19 ENCOUNTER — Encounter: Admit: 2020-03-19 | Discharge: 2020-03-19 | Payer: Commercial Managed Care - PPO

## 2020-03-19 NOTE — Progress Notes
Outpatient labs faxed to [facility] in [place] [phone] per patient's mom request

## 2020-03-21 ENCOUNTER — Encounter: Admit: 2020-03-21 | Discharge: 2020-03-21 | Payer: Commercial Managed Care - PPO

## 2020-03-23 ENCOUNTER — Encounter: Admit: 2020-03-23 | Discharge: 2020-03-23 | Payer: Commercial Managed Care - PPO

## 2020-03-30 ENCOUNTER — Encounter: Admit: 2020-03-30 | Discharge: 2020-03-30 | Payer: Commercial Managed Care - PPO

## 2020-03-30 DIAGNOSIS — E230 Hypopituitarism: Secondary | ICD-10-CM

## 2020-03-31 ENCOUNTER — Encounter: Admit: 2020-03-31 | Discharge: 2020-03-31 | Payer: Commercial Managed Care - PPO

## 2020-03-31 NOTE — Progress Notes
Referral placed to Dr Yang-Endocrinology at [facility] per mom request. [phone]

## 2020-05-20 ENCOUNTER — Encounter: Admit: 2020-05-20 | Discharge: 2020-05-20 | Payer: Commercial Managed Care - PPO

## 2020-05-20 ENCOUNTER — Ambulatory Visit: Admit: 2020-05-20 | Discharge: 2020-05-21 | Payer: Commercial Managed Care - PPO

## 2020-05-20 DIAGNOSIS — S069X9S Unspecified intracranial injury with loss of consciousness of unspecified duration, sequela: Secondary | ICD-10-CM

## 2020-05-20 DIAGNOSIS — B999 Unspecified infectious disease: Secondary | ICD-10-CM

## 2020-05-20 DIAGNOSIS — I2699 Other pulmonary embolism without acute cor pulmonale: Secondary | ICD-10-CM

## 2020-05-20 DIAGNOSIS — I639 Cerebral infarction, unspecified: Secondary | ICD-10-CM

## 2020-05-20 DIAGNOSIS — R252 Cramp and spasm: Secondary | ICD-10-CM

## 2020-05-20 DIAGNOSIS — S069X9A Unspecified intracranial injury with loss of consciousness of unspecified duration, initial encounter: Secondary | ICD-10-CM

## 2020-05-20 DIAGNOSIS — G8250 Quadriplegia, unspecified: Secondary | ICD-10-CM

## 2020-05-20 MED ORDER — INCOBOTULINUMTOXINA 100 UNIT IM SOLR
400 [IU] | Freq: Once | INTRAMUSCULAR | 0 refills | Status: AC
Start: 2020-05-20 — End: ?
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
## 2020-06-04 ENCOUNTER — Encounter: Admit: 2020-06-04 | Discharge: 2020-06-04 | Payer: Commercial Managed Care - PPO

## 2020-06-04 ENCOUNTER — Ambulatory Visit: Admit: 2020-06-04 | Discharge: 2020-06-05 | Payer: MEDICARE

## 2020-06-04 DIAGNOSIS — G40209 Localization-related (focal) (partial) symptomatic epilepsy and epileptic syndromes with complex partial seizures, not intractable, without status epilepticus: Secondary | ICD-10-CM

## 2020-06-04 DIAGNOSIS — I2699 Other pulmonary embolism without acute cor pulmonale: Secondary | ICD-10-CM

## 2020-06-04 DIAGNOSIS — B999 Unspecified infectious disease: Secondary | ICD-10-CM

## 2020-06-04 DIAGNOSIS — S069X9A Unspecified intracranial injury with loss of consciousness of unspecified duration, initial encounter: Secondary | ICD-10-CM

## 2020-06-04 DIAGNOSIS — G5131 Clonic hemifacial spasm, right: Secondary | ICD-10-CM

## 2020-06-04 DIAGNOSIS — I639 Cerebral infarction, unspecified: Secondary | ICD-10-CM

## 2020-06-04 MED ORDER — GABAPENTIN 250 MG/5 ML (5 ML) PO SOLN
3 mL | Freq: Three times a day (TID) | ORAL | 11 refills | Status: AC
Start: 2020-06-04 — End: ?

## 2020-06-04 MED ORDER — LEVETIRACETAM 100 MG/ML PO SOLN
500 mg | Freq: Two times a day (BID) | ORAL | 11 refills | 90.00000 days | Status: AC
Start: 2020-06-04 — End: ?
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
## 2020-06-04 NOTE — Assessment & Plan Note
Doing well. No seizures. Last seizrues around June of 2019.   Will continue Keppra 500 mg BID

## 2020-06-04 NOTE — Assessment & Plan Note
Stable- continue gabapentin 3 mls TID

## 2020-06-17 ENCOUNTER — Encounter

## 2020-06-17 DIAGNOSIS — I639 Cerebral infarction, unspecified: Secondary | ICD-10-CM

## 2020-06-17 DIAGNOSIS — S069X9A Unspecified intracranial injury with loss of consciousness of unspecified duration, initial encounter: Secondary | ICD-10-CM

## 2020-06-17 DIAGNOSIS — R252 Cramp and spasm: Secondary | ICD-10-CM

## 2020-06-17 DIAGNOSIS — I2699 Other pulmonary embolism without acute cor pulmonale: Secondary | ICD-10-CM

## 2020-06-17 DIAGNOSIS — B999 Unspecified infectious disease: Secondary | ICD-10-CM

## 2020-06-17 DIAGNOSIS — G8250 Quadriplegia, unspecified: Secondary | ICD-10-CM

## 2020-08-19 ENCOUNTER — Encounter: Admit: 2020-08-19 | Discharge: 2020-08-19 | Payer: Commercial Managed Care - PPO

## 2020-08-19 ENCOUNTER — Ambulatory Visit: Admit: 2020-08-19 | Discharge: 2020-08-20 | Payer: MEDICARE

## 2020-08-19 DIAGNOSIS — S069X9S Unspecified intracranial injury with loss of consciousness of unspecified duration, sequela: Secondary | ICD-10-CM

## 2020-08-19 DIAGNOSIS — R252 Cramp and spasm: Secondary | ICD-10-CM

## 2020-08-19 DIAGNOSIS — G8250 Quadriplegia, unspecified: Secondary | ICD-10-CM

## 2020-08-19 DIAGNOSIS — M25671 Stiffness of right ankle, not elsewhere classified: Secondary | ICD-10-CM

## 2020-08-19 MED ORDER — INCOBOTULINUMTOXINA 100 UNIT IM SOLR
400 [IU] | Freq: Once | INTRAMUSCULAR | 0 refills | Status: AC
Start: 2020-08-19 — End: ?

## 2020-08-19 NOTE — Progress Notes
Faxed order for AFO splints to [facility] [address] (F: [phone]). Received confirmation of successful transmission.

## 2020-09-14 ENCOUNTER — Encounter: Admit: 2020-09-14 | Discharge: 2020-09-14 | Payer: Commercial Managed Care - PPO

## 2020-09-14 ENCOUNTER — Ambulatory Visit: Admit: 2020-09-14 | Discharge: 2020-09-15 | Payer: Commercial Managed Care - PPO

## 2020-09-14 DIAGNOSIS — S069X9A Unspecified intracranial injury with loss of consciousness of unspecified duration, initial encounter: Secondary | ICD-10-CM

## 2020-09-14 DIAGNOSIS — I2699 Other pulmonary embolism without acute cor pulmonale: Secondary | ICD-10-CM

## 2020-09-14 DIAGNOSIS — I639 Cerebral infarction, unspecified: Secondary | ICD-10-CM

## 2020-09-14 DIAGNOSIS — B999 Unspecified infectious disease: Secondary | ICD-10-CM

## 2020-09-14 NOTE — Procedures
Neurorehabilitation Medicine - Procedure Visit  Physical Medicine & Rehabilitation  [facility]    Date of Service: 09/14/2020    Date of last botulinum toxin injection (any body site): 06/17/2020  Complications or side effects with last injections: No  Current or recent fevers, chills, cough, difficulty breathing: No  Currently taking antibiotics: No  Currently taking any anti-coagulant medications: No  Other spasticity medications: Yes, oral baclofen    Brief History/ Exam: 25 y.o. 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  No side effects from previous injections.  No significant changes requested to previous regimen.  She has had great success with outpatient aqua/ water therapy for improved range of motion and motor activation in her arms and legs.  She is ready for new bilateral custom molded plastic AFOs to continue to address her spastic ankle plantar flexion positioning.  The left foot still turns in while the right does not as much. Stable spastic tetraparesis on exam with left equinovarus position and right ankle plantar flexion contracture.  Neither able to be ranged to neutral.  She uses a right thumbs up to communicate yes.      PROCEDURE NOTE - Botulinum Toxin Injection    Procedure Date: 09/14/2020  Procedure: Botulinum Toxin Injections   Indication/ Diagnosis:   1. Spasticity  CHEMODENERVATION EXTREMITY MUSCLES   2. Tetraparesis (HCC)  CHEMODENERVATION EXTREMITY MUSCLES     Prior to beginning the procedure, the risks (pain, bleeding, infection, muscle weakness, allergic reaction, dysphagia/ dyspnea) and benefits (spasticity reduction, improved positioning/ ROM, decreased burden of care) were explained and the patient and/or parents consented to botulinum toxin injections of the right upper limb and bilateral lower limbs.   A written consent form was also completed and signed by the patient's mother.    Medication: incobotulinumtoxinA  Lot Number: [id number]  Expiration Date: 01/2022  Total Amount Reconstituted: 400 Units  Dilution: 1:4    The botulinum toxin was reconstituted in 16 mL of preservative free saline (Lot # [id number] 01/2021) in a 100 Units to 4 mL ratio.  A time out was performed to locate and confirm the correct limb for injection.  The skin was prepped with alcohol prior to each injection.  Audible EMG guidance was used to localize the desired muscles.  The below listed amounts of botulinum toxin were injected into the below listed muscles via a 37 mm x 27 gauge needle electrode (unless otherwise specified) with aspiration prior to each injection.  The procedure was well tolerated with minimal bleeding that resolved after brief pressure was applied.    Muscles Injected:      Right Upper Limb  Right Triceps 25 Units  Right Flexor Carpi Radialis 25 Units  Right Flexor Digitorum Superficialis 25 Units  Right Flexor Digitorum Profundus 25 Units  ?  Right lower limb  Right medial gastrocnemius 50 units  Right lateral gastrocnemius 50 units  Right soleus 50 units  ?  Left lower limb  Left medial gastrocnemius 33.3 units  Left lateral gastrocnemius 33.3 units  Left soleus 33.3 units  Left posterior tibialis 50 units    Total Dose: 400 Units, no botulinum toxin was wasted       Administrations This Visit     INCObotulinum toxin A (XEOMIN) injection 400 Units     Admin Date  09/14/2020 Action  Given Dose  400 Units Route  Intramuscular Administered By  Su Monks, MD  PLAN:   -The patient will be eligible for repeat botulinum toxin injection in 3-4 months if clinically indicated.  -Follow-up in this clinic in 4-8 weeks to assess efficacy of the above injections (ok via tele-health).  -Continue home splinting, stretching, positioning, and therapy program.  -New prescription provided for bilateral custom molded plastic AFOs  -Continue with outpatient aqua/ pool therapy.    Orders Placed This Encounter   ? ORTHOTICS-EXTERNAL Su Monks, MD

## 2020-09-15 ENCOUNTER — Encounter: Admit: 2020-09-15 | Discharge: 2020-09-15 | Payer: Commercial Managed Care - PPO

## 2020-09-15 ENCOUNTER — Ambulatory Visit: Admit: 2020-09-15 | Discharge: 2020-09-15 | Payer: MEDICARE

## 2020-09-15 DIAGNOSIS — G8250 Quadriplegia, unspecified: Secondary | ICD-10-CM

## 2020-09-15 DIAGNOSIS — I639 Cerebral infarction, unspecified: Secondary | ICD-10-CM

## 2020-09-15 DIAGNOSIS — B999 Unspecified infectious disease: Secondary | ICD-10-CM

## 2020-09-15 DIAGNOSIS — I2699 Other pulmonary embolism without acute cor pulmonale: Secondary | ICD-10-CM

## 2020-09-15 DIAGNOSIS — J9504 Tracheo-esophageal fistula following tracheostomy: Secondary | ICD-10-CM

## 2020-09-15 DIAGNOSIS — S069X6D Unspecified intracranial injury with loss of consciousness greater than 24 hours without return to pre-existing conscious level with patient surviving, subsequent encounter: Secondary | ICD-10-CM

## 2020-09-15 DIAGNOSIS — R252 Cramp and spasm: Principal | ICD-10-CM

## 2020-09-15 DIAGNOSIS — S069X9A Unspecified intracranial injury with loss of consciousness of unspecified duration, initial encounter: Secondary | ICD-10-CM

## 2020-09-15 DIAGNOSIS — Z9889 Other specified postprocedural states: Secondary | ICD-10-CM

## 2020-09-15 DIAGNOSIS — R1312 Dysphagia, oropharyngeal phase: Secondary | ICD-10-CM

## 2020-10-26 ENCOUNTER — Encounter: Admit: 2020-10-26 | Discharge: 2020-10-26 | Payer: Commercial Managed Care - PPO

## 2020-10-26 ENCOUNTER — Ambulatory Visit: Admit: 2020-10-26 | Discharge: 2020-10-27 | Payer: MEDICARE

## 2020-10-26 DIAGNOSIS — G8250 Quadriplegia, unspecified: Secondary | ICD-10-CM

## 2020-10-26 DIAGNOSIS — I2699 Other pulmonary embolism without acute cor pulmonale: Secondary | ICD-10-CM

## 2020-10-26 DIAGNOSIS — S069X9S Unspecified intracranial injury with loss of consciousness of unspecified duration, sequela: Secondary | ICD-10-CM

## 2020-10-26 DIAGNOSIS — B999 Unspecified infectious disease: Secondary | ICD-10-CM

## 2020-10-26 DIAGNOSIS — I639 Cerebral infarction, unspecified: Secondary | ICD-10-CM

## 2020-10-26 DIAGNOSIS — S069X9A Unspecified intracranial injury with loss of consciousness of unspecified duration, initial encounter: Secondary | ICD-10-CM

## 2020-10-26 MED ORDER — INCOBOTULINUMTOXINA 100 UNIT IM SOLR
400 [IU] | Freq: Once | INTRAMUSCULAR | 0 refills | Status: AC
Start: 2020-10-26 — End: ?
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
## 2020-10-27 DIAGNOSIS — R252 Cramp and spasm: Principal | ICD-10-CM

## 2020-11-25 ENCOUNTER — Encounter: Admit: 2020-11-25 | Discharge: 2020-11-25 | Payer: Commercial Managed Care - PPO

## 2020-12-31 ENCOUNTER — Encounter: Admit: 2020-12-31 | Discharge: 2020-12-31 | Payer: Commercial Managed Care - PPO

## 2020-12-31 MED ORDER — LEVETIRACETAM 100 MG/ML PO SOLN
500 mg | Freq: Two times a day (BID) | ORAL | 3 refills | 90.00000 days | Status: AC
Start: 2020-12-31 — End: ?

## 2020-12-31 MED ORDER — GABAPENTIN 250 MG/5 ML (5 ML) PO SOLN
3 mL | Freq: Three times a day (TID) | ORAL | 3 refills | Status: AC
Start: 2020-12-31 — End: ?

## 2021-01-07 ENCOUNTER — Encounter: Admit: 2021-01-07 | Discharge: 2021-01-07 | Payer: Commercial Managed Care - PPO

## 2021-01-07 DIAGNOSIS — S069X6D Unspecified intracranial injury with loss of consciousness greater than 24 hours without return to pre-existing conscious level with patient surviving, subsequent encounter: Secondary | ICD-10-CM

## 2021-01-07 DIAGNOSIS — Z9889 Other specified postprocedural states: Secondary | ICD-10-CM

## 2021-01-07 DIAGNOSIS — R1312 Dysphagia, oropharyngeal phase: Secondary | ICD-10-CM

## 2021-01-18 ENCOUNTER — Encounter: Admit: 2021-01-18 | Discharge: 2021-01-18 | Payer: Commercial Managed Care - PPO

## 2021-01-18 ENCOUNTER — Ambulatory Visit: Admit: 2021-01-18 | Discharge: 2021-01-18 | Payer: MEDICARE

## 2021-01-18 DIAGNOSIS — R252 Cramp and spasm: Secondary | ICD-10-CM

## 2021-01-18 DIAGNOSIS — I639 Cerebral infarction, unspecified: Secondary | ICD-10-CM

## 2021-01-18 DIAGNOSIS — S069X9A Unspecified intracranial injury with loss of consciousness of unspecified duration, initial encounter: Secondary | ICD-10-CM

## 2021-01-18 DIAGNOSIS — B999 Unspecified infectious disease: Secondary | ICD-10-CM

## 2021-01-18 DIAGNOSIS — I2699 Other pulmonary embolism without acute cor pulmonale: Secondary | ICD-10-CM

## 2021-01-18 MED ORDER — INCOBOTULINUMTOXINA 100 UNIT IM SOLR
400 [IU] | Freq: Once | INTRAMUSCULAR | 0 refills | Status: CP
Start: 2021-01-18 — End: ?

## 2021-01-25 ENCOUNTER — Ambulatory Visit: Admit: 2021-01-25 | Discharge: 2021-01-25 | Payer: Commercial Managed Care - PPO

## 2021-01-25 ENCOUNTER — Ambulatory Visit: Admit: 2021-01-25 | Discharge: 2021-01-25 | Payer: MEDICARE

## 2021-01-25 ENCOUNTER — Encounter: Admit: 2021-01-25 | Discharge: 2021-01-25 | Payer: Commercial Managed Care - PPO

## 2021-01-25 DIAGNOSIS — S069X6D Unspecified intracranial injury with loss of consciousness greater than 24 hours without return to pre-existing conscious level with patient surviving, subsequent encounter: Secondary | ICD-10-CM

## 2021-01-25 DIAGNOSIS — R1312 Dysphagia, oropharyngeal phase: Secondary | ICD-10-CM

## 2021-01-25 DIAGNOSIS — R0603 Acute respiratory distress: Secondary | ICD-10-CM

## 2021-01-25 DIAGNOSIS — Z9889 Other specified postprocedural states: Secondary | ICD-10-CM

## 2021-01-25 MED ORDER — BARIUM SULFATE 40 % (W/V), 30% (W/W) PO PSTE
10 mL | Freq: Once | ORAL | 0 refills | Status: CP
Start: 2021-01-25 — End: ?
  Administered 2021-01-25: 17:00:00 10 mL via ORAL

## 2021-01-25 MED ORDER — BARIUM SULFATE 40 % (W/V) PO SUSP
10 mL | Freq: Once | ORAL | 0 refills | Status: CP
Start: 2021-01-25 — End: ?
  Administered 2021-01-25: 17:00:00 10 mL via ORAL

## 2021-01-25 MED ORDER — BARIUM SULFATE 81 % (W/W) PO POWD
10 mL | Freq: Once | ORAL | 0 refills | Status: CP
Start: 2021-01-25 — End: ?
  Administered 2021-01-25: 17:00:00 10 mL via ORAL
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
## 2021-02-12 ENCOUNTER — Encounter: Admit: 2021-02-12 | Discharge: 2021-02-12 | Payer: Commercial Managed Care - PPO

## 2021-02-12 NOTE — Telephone Encounter
[facility] pre-anesthesia clinic called requesting medical records for office visit with Dr Hale Bogus.  Returned call to Marisue Ivan at [facility] pre-anesthesia clinic at [phone].  Explained we do not have  records of office visit with Dr. Hale Bogus however she may have been seen by Dr Hale Bogus while in-patient at [facility].  Directed Marisue Ivan to contact [facility] for records.  Explained I will fax a copy of the echo we do have to [facility] Fax#[phone]

## 2021-02-14 ENCOUNTER — Encounter: Admit: 2021-02-14 | Discharge: 2021-02-14 | Payer: Commercial Managed Care - PPO

## 2021-03-08 ENCOUNTER — Encounter: Admit: 2021-03-08 | Discharge: 2021-03-08 | Payer: Commercial Managed Care - PPO

## 2021-03-17 ENCOUNTER — Encounter: Admit: 2021-03-17 | Discharge: 2021-03-17 | Payer: Commercial Managed Care - PPO

## 2021-03-17 DIAGNOSIS — B999 Unspecified infectious disease: Secondary | ICD-10-CM

## 2021-03-17 DIAGNOSIS — I2699 Other pulmonary embolism without acute cor pulmonale: Secondary | ICD-10-CM

## 2021-03-17 DIAGNOSIS — S069XAA Traumatic brain injury: Secondary | ICD-10-CM

## 2021-03-17 DIAGNOSIS — I639 Cerebral infarction, unspecified: Secondary | ICD-10-CM

## 2021-04-21 ENCOUNTER — Ambulatory Visit: Admit: 2021-04-21 | Discharge: 2021-04-21 | Payer: MEDICARE

## 2021-04-21 ENCOUNTER — Encounter: Admit: 2021-04-21 | Discharge: 2021-04-21 | Payer: Commercial Managed Care - PPO

## 2021-04-21 DIAGNOSIS — R252 Cramp and spasm: Secondary | ICD-10-CM

## 2021-04-21 MED ORDER — INCOBOTULINUMTOXINA 100 UNIT IM SOLR
400 [IU] | Freq: Once | INTRAMUSCULAR | 0 refills | Status: CP
Start: 2021-04-21 — End: ?
  Administered 2021-04-21: 19:00:00 400 [IU] via INTRAMUSCULAR

## 2021-04-21 NOTE — Patient Instructions
It was a pleasure seeing you in clinic today.    Rick Djuana Littleton RN, BSN  Clinical Nurse Coordinator  Physical Medicine & Rehab  Dr. Alexandra Arickx  [facility]  [facility]  [address]  [place], [place] [address]    Phone: [phone]  Fax [phone]  For scheduling, cancelling, or changing appointments [phone]    For prescription refills, please contact your pharmacy.  Please allow 2-3 days for refill requests to be filled.      For information on spinal conditions, please visit [email or web address]

## 2021-04-21 NOTE — Procedures
Neurorehabilitation Medicine - Procedure Visit  Physical Medicine & Rehabilitation  [facility]    Date of Service: 04/21/2021    Date of last botulinum toxin injection (any body site): 01/18/2021  Complications or side effects with last injections: No  Current or recent fevers, chills, cough, difficulty breathing: No  Currently taking antibiotics: No  Currently taking any anti-coagulant medications: No  Other spasticity medications: Yes, oral baclofen     Brief History/ Exam: 25 y.o. 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  No side effects from previous injections.  No significant changes requested to previous regimen.  No recent changes to splinting or positioning plan.  Stable spastic tetraparesis on exam with left equinovarus position and right ankle plantar flexion contracture.  She uses a left thumbs up to communicate yes.     Vitals:    04/21/21 1225   BP: 95/63   Pulse: 70   SpO2: 97%       PROCEDURE NOTE - Botulinum Toxin Injection    Procedure Date: 04/21/2021  Procedure: Botulinum Toxin Injections   Indication/ Diagnosis:   1. Spasticity  CHEMODENERVATION EXTREMITY MUSCLES        Prior to beginning the procedure, the risks (pain, bleeding, infection, muscle weakness, allergic reaction, dysphagia/ dyspnea) and benefits (spasticity reduction, improved positioning/ ROM, decreased burden of care) were explained and the patient and/or parents consented to botulinum toxin injections of the right upper limb and bilateral lower limbs.   A written consent form was also completed and signed by the patient's father.    Medication: incobotulinumtoxinA  Total Amount Reconstituted: 400 Units  Dilution: 1:4    The botulinum toxin was reconstituted in 16 mL of preservative free saline in a 100 Units to 4 mL ratio.  A time out was performed to locate and confirm the correct limb for injection.  The skin was prepped with alcohol prior to each injection.  Audible EMG guidance was used to localize the desired muscles.  The below listed amounts of botulinum toxin were injected into the below listed muscles via a 37 mm x 27 gauge needle electrode (unless otherwise specified) with aspiration prior to each injection.  The procedure was well tolerated with minimal bleeding that resolved after brief pressure was applied.    Muscles Injected:      Right Upper Limb  Right Triceps 25 Units  Right Flexor Carpi Radialis 25 Units  Right Flexor Digitorum Superficialis 25 Units  Right Flexor Digitorum Profundus 25 Units  ?  Right lower limb  Right medial gastrocnemius 50 units  Right lateral gastrocnemius 50 units  Right soleus 50 units  ?  Left lower limb  Left medial gastrocnemius 33.3 units  Left lateral gastrocnemius 33.3 units  Left soleus 33.3 units  Left posterior tibialis 50 units    Total Dose: 400 Units, no botulinum toxin was wasted       Administrations This Visit     INCObotulinum toxin A (XEOMIN) injection 400 Units     Admin Date  04/21/2021 Action  Given Dose  400 Units Route  Intramuscular Administered By  Su Monks, MD                PLAN:   -The patient will be eligible for repeat botulinum toxin injection in 3-4 months if clinically indicated.  -Follow-up in this clinic in 4-8 weeks as needed to assess efficacy of the above injections (ok via tele-health).  -Continue home splinting, stretching, positioning,  and therapy program.      Su Monks, MD

## 2021-06-28 ENCOUNTER — Encounter: Admit: 2021-06-28 | Discharge: 2021-06-28 | Payer: Commercial Managed Care - PPO

## 2021-06-28 MED ORDER — GABAPENTIN 250 MG/5 ML (5 ML) PO SOLN
3 mL | Freq: Three times a day (TID) | ORAL | 0 refills | Status: AC
Start: 2021-06-28 — End: ?

## 2021-07-20 ENCOUNTER — Encounter: Admit: 2021-07-20 | Discharge: 2021-07-20 | Payer: MEDICARE

## 2021-07-20 ENCOUNTER — Ambulatory Visit: Admit: 2021-07-20 | Discharge: 2021-07-20 | Payer: MEDICARE

## 2021-07-20 DIAGNOSIS — I2699 Other pulmonary embolism without acute cor pulmonale: Secondary | ICD-10-CM

## 2021-07-20 DIAGNOSIS — S069XAA Traumatic brain injury (HCC): Secondary | ICD-10-CM

## 2021-07-20 DIAGNOSIS — Z9889 Other specified postprocedural states: Secondary | ICD-10-CM

## 2021-07-20 DIAGNOSIS — B999 Unspecified infectious disease: Secondary | ICD-10-CM

## 2021-07-20 DIAGNOSIS — I639 Cerebral infarction, unspecified: Secondary | ICD-10-CM

## 2021-07-20 DIAGNOSIS — S098XXA Other specified injuries of head, initial encounter: Secondary | ICD-10-CM

## 2021-07-20 DIAGNOSIS — R1312 Dysphagia, oropharyngeal phase: Secondary | ICD-10-CM
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
## 2021-07-28 ENCOUNTER — Encounter: Admit: 2021-07-28 | Discharge: 2021-07-28 | Payer: MEDICARE

## 2021-07-28 ENCOUNTER — Ambulatory Visit: Admit: 2021-07-28 | Discharge: 2021-07-28 | Payer: Commercial Managed Care - HMO

## 2021-07-28 DIAGNOSIS — R252 Cramp and spasm: Secondary | ICD-10-CM

## 2021-07-28 DIAGNOSIS — G8250 Quadriplegia, unspecified: Secondary | ICD-10-CM

## 2021-07-28 MED ORDER — INCOBOTULINUMTOXINA 100 UNIT IM SOLR
400 [IU] | Freq: Once | INTRAMUSCULAR | 0 refills | Status: CP
Start: 2021-07-28 — End: ?
  Administered 2021-07-28: 19:00:00 400 [IU] via INTRAMUSCULAR

## 2021-07-28 NOTE — Procedures
Neurorehabilitation Medicine - Procedure Visit  Physical Medicine & Rehabilitation  [facility]    Date of Service: 07/28/2021    Date of last botulinum toxin injection (any body site): 04/21/2021  Complications or side effects with last injections: No  Current or recent fevers, chills, cough, difficulty breathing: No  Currently taking antibiotics: No  Currently taking any anti-coagulant medications: No  Other spasticity medications: Yes, oral baclofen     Brief History/ Exam: 26 y.o. 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  No side effects from previous injections.  No significant changes requested to previous regimen.  No recent changes to splinting or positioning plan but they are hoping to get a new splint for her left hand.  They are working to optimize range of motion since that is the arm she uses the most functionally.  We will continue to avoid injections in the left upper limb since she uses tone functionally for grip in that limb.  Stable spastic tetraparesis on exam with left equinovarus position and right ankle plantar flexion contracture.  She uses a left thumbs up to communicate yes.     Vitals:    07/28/21 1212   BP: 104/73   Pulse: 64   SpO2: 100%       PROCEDURE NOTE - Botulinum Toxin Injection    Procedure Date: 07/28/2021  Procedure: Botulinum Toxin Injections   Indication/ Diagnosis:   1. Spasticity  CHEMODENERVATION EXTREMITY MUSCLES        Prior to beginning the procedure, the risks (pain, bleeding, infection, muscle weakness, allergic reaction, dysphagia/ dyspnea) and benefits (spasticity reduction, improved positioning/ ROM, decreased burden of care) were explained and the patient and/or parents consented to botulinum toxin injections of the right upper limb and bilateral lower limbs.   An electronic consent form was also completed and signed by the patient's mother.    Medication: incobotulinumtoxinA  Total Amount Reconstituted: 400 Units  Dilution: 1:4    The botulinum toxin was reconstituted in 16 mL of preservative free saline in a 100 Units to 4 mL ratio.  A time out was performed to locate and confirm the correct limb for injection.  The skin was prepped with alcohol prior to each injection.  Audible EMG guidance was used to localize the desired muscles.  The below listed amounts of botulinum toxin were injected into the below listed muscles via a 37 mm x 27 gauge needle electrode (unless otherwise specified) with aspiration prior to each injection.  The procedure was well tolerated with minimal bleeding that resolved after brief pressure was applied.    Muscles Injected:      Right Upper Limb  Right Pronator Teres 25 Units  Right Flexor Carpi Radialis 25 Units  Right Flexor Digitorum Superficialis 25 Units  Right Flexor Digitorum Profundus 25 Units    Right lower limb  Right medial gastrocnemius 33.3 units  Right lateral gastrocnemius 33.3 units  Right soleus 33.3 units  ?  Left lower limb  Left medial gastrocnemius 50 units  Left lateral gastrocnemius 50 units  Left soleus 50 units  Left posterior tibialis 50 units    Total Dose: 400 Units, no botulinum toxin was wasted       Administrations This Visit     INCObotulinum toxin A (XEOMIN) injection 400 Units     Admin Date  07/28/2021 Action  Given Dose  400 Units Route  Intramuscular Administered By  Su Monks, MD  PLAN:   -The patient will be eligible for repeat botulinum toxin injections in 3-4 months if clinically indicated.  -Follow-up in this clinic in 4-8 weeks as needed to assess efficacy of the above injections (ok via tele-health).  -Continue home splinting, stretching, positioning, and therapy program.    Orders Placed This Encounter   ? ORTHOTICS-EXTERNAL         Su Monks, MD

## 2021-07-28 NOTE — Patient Instructions
It was a pleasure seeing you in clinic today.    Rick Sakshi Sermons RN, BSN  Clinical Nurse Coordinator  Physical Medicine & Rehab  Dr. Alexandra Arickx  [facility]  [facility]  [address]  [place], [place] [address]    Phone: [phone]  Fax [phone]  For scheduling, cancelling, or changing appointments [phone]    For prescription refills, please contact your pharmacy.  Please allow 2-3 days for refill requests to be filled.      For information on spinal conditions, please visit [email or web address]

## 2021-07-29 ENCOUNTER — Encounter: Admit: 2021-07-29 | Discharge: 2021-07-29 | Payer: MEDICARE

## 2021-07-29 MED ORDER — LEVETIRACETAM 100 MG/ML PO SOLN
500 mg | Freq: Two times a day (BID) | ORAL | 0 refills | 90.00000 days | Status: AC
Start: 2021-07-29 — End: ?

## 2021-08-13 ENCOUNTER — Encounter: Admit: 2021-08-13 | Discharge: 2021-08-13 | Payer: MEDICARE

## 2021-08-13 DIAGNOSIS — R252 Cramp and spasm: Secondary | ICD-10-CM

## 2021-09-16 ENCOUNTER — Encounter: Admit: 2021-09-16 | Discharge: 2021-09-16 | Payer: MEDICARE

## 2021-09-16 NOTE — Telephone Encounter
Pt's mom left VM requesting a call back to discuss a referral to outpatient speech therapy at [facility]. Mom can be reached at [phone].

## 2021-09-16 NOTE — Telephone Encounter
This nurse called and spoke to Sherri [name] regarding her request for a speech therapy referral    Per Sherri [name], [facility] in [place] had said they are about two weeks out for scheduling evaluations and they would see Sherri [name].     This nurse confirmed this information and informed Sherri [name] this nurse would get the referral and Dr. Joelene Millin notes faxed over to them.     Sherri [name] had no questions at the end of the call    Mingo Amber, RN

## 2021-09-16 NOTE — Telephone Encounter
Referral, FEES, and last two office notes from Dr. Glendon Axe faxed to [facility], [phone] as requested by Aimee.     Mingo Amber, RN

## 2021-09-28 ENCOUNTER — Encounter: Admit: 2021-09-28 | Discharge: 2021-09-28 | Payer: MEDICARE

## 2021-09-28 MED ORDER — LEVETIRACETAM 100 MG/ML PO SOLN
500 mg | Freq: Two times a day (BID) | ORAL | 0 refills
Start: 2021-09-28 — End: ?

## 2021-10-11 ENCOUNTER — Encounter: Admit: 2021-10-11 | Discharge: 2021-10-11 | Payer: MEDICARE

## 2021-10-27 ENCOUNTER — Encounter: Admit: 2021-10-27 | Discharge: 2021-10-27 | Payer: MEDICARE

## 2021-10-27 ENCOUNTER — Ambulatory Visit: Admit: 2021-10-27 | Discharge: 2021-10-27 | Payer: Commercial Managed Care - HMO

## 2021-10-27 DIAGNOSIS — M25671 Stiffness of right ankle, not elsewhere classified: Secondary | ICD-10-CM

## 2021-10-27 DIAGNOSIS — R252 Cramp and spasm: Secondary | ICD-10-CM

## 2021-10-27 MED ORDER — INCOBOTULINUMTOXINA 100 UNIT IM SOLR
400 [IU] | Freq: Once | INTRAMUSCULAR | 0 refills | Status: CP
Start: 2021-10-27 — End: ?
  Administered 2021-10-27: 17:00:00 400 [IU] via INTRAMUSCULAR

## 2021-10-27 MED ORDER — GABAPENTIN 250 MG/5 ML (5 ML) PO SOLN
3 mL | Freq: Three times a day (TID) | ORAL | 0 refills | Status: AC
Start: 2021-10-27 — End: ?

## 2021-10-27 NOTE — Patient Instructions
It was a pleasure seeing you in clinic today.    Rick Meyer RN, BSN  Clinical Nurse Coordinator  Physical Medicine & Rehab  Dr. Alexandra Arickx  [facility]  [facility]  [address]  [place], [place] [address]    Phone: [phone]  Fax [phone]  For scheduling, cancelling, or changing appointments [phone]    For prescription refills, please contact your pharmacy.  Please allow 2-3 days for refill requests to be filled.      For information on spinal conditions, please visit [email or web address]

## 2021-11-01 ENCOUNTER — Encounter: Admit: 2021-11-01 | Discharge: 2021-11-01 | Payer: Commercial Managed Care - HMO

## 2021-11-15 ENCOUNTER — Encounter: Admit: 2021-11-15 | Discharge: 2021-11-15 | Payer: Commercial Managed Care - HMO

## 2021-11-17 ENCOUNTER — Encounter: Admit: 2021-11-17 | Discharge: 2021-11-17 | Payer: Commercial Managed Care - HMO

## 2021-11-17 DIAGNOSIS — R252 Cramp and spasm: Secondary | ICD-10-CM

## 2021-11-19 ENCOUNTER — Encounter: Admit: 2021-11-19 | Discharge: 2021-11-19 | Payer: Commercial Managed Care - HMO

## 2021-11-29 ENCOUNTER — Encounter: Admit: 2021-11-29 | Discharge: 2021-11-29 | Payer: Commercial Managed Care - HMO

## 2021-11-30 ENCOUNTER — Encounter: Admit: 2021-11-30 | Discharge: 2021-11-30 | Payer: Commercial Managed Care - HMO

## 2021-12-01 ENCOUNTER — Encounter: Admit: 2021-12-01 | Discharge: 2021-12-01 | Payer: Commercial Managed Care - HMO

## 2021-12-01 DIAGNOSIS — R252 Cramp and spasm: Secondary | ICD-10-CM

## 2021-12-06 ENCOUNTER — Encounter: Admit: 2021-12-06 | Discharge: 2021-12-06 | Payer: Commercial Managed Care - HMO

## 2021-12-06 DIAGNOSIS — R252 Cramp and spasm: Secondary | ICD-10-CM

## 2021-12-08 ENCOUNTER — Encounter: Admit: 2021-12-08 | Discharge: 2021-12-08 | Payer: Commercial Managed Care - HMO

## 2021-12-21 ENCOUNTER — Encounter: Admit: 2021-12-21 | Discharge: 2021-12-21 | Payer: Commercial Managed Care - HMO

## 2021-12-23 ENCOUNTER — Encounter: Admit: 2021-12-23 | Discharge: 2021-12-23 | Payer: Commercial Managed Care - HMO

## 2021-12-23 DIAGNOSIS — M21371 Foot drop, right foot: Secondary | ICD-10-CM

## 2021-12-23 MED ORDER — LEVETIRACETAM 100 MG/ML PO SOLN
ORAL | 0 refills | 90.00000 days | Status: AC
Start: 2021-12-23 — End: ?
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
## 2021-12-24 ENCOUNTER — Ambulatory Visit: Admit: 2021-12-24 | Discharge: 2021-12-24 | Payer: Commercial Managed Care - HMO

## 2021-12-24 ENCOUNTER — Encounter: Admit: 2021-12-24 | Discharge: 2021-12-24 | Payer: Commercial Managed Care - HMO

## 2021-12-24 DIAGNOSIS — M21371 Foot drop, right foot: Secondary | ICD-10-CM

## 2021-12-24 DIAGNOSIS — I639 Cerebral infarction, unspecified: Secondary | ICD-10-CM

## 2021-12-24 DIAGNOSIS — I2699 Other pulmonary embolism without acute cor pulmonale: Secondary | ICD-10-CM

## 2021-12-24 DIAGNOSIS — B999 Unspecified infectious disease: Secondary | ICD-10-CM

## 2021-12-24 DIAGNOSIS — S069XAA Traumatic brain injury (HCC): Secondary | ICD-10-CM

## 2021-12-24 NOTE — Patient Instructions
Please do not hesitate to contact my office with any questions.    Dr. William Tucker MD - Orthopedic Surgery  [facility] - Phone [phone] - Fax [phone]   [address]    Elnoria Exavier Lina BSN, RN - Clinical Coordinator

## 2021-12-29 ENCOUNTER — Encounter: Admit: 2021-12-29 | Discharge: 2021-12-29 | Payer: Commercial Managed Care - HMO

## 2022-01-14 ENCOUNTER — Encounter: Admit: 2022-01-14 | Discharge: 2022-01-14 | Payer: Commercial Managed Care - HMO

## 2022-01-17 ENCOUNTER — Encounter: Admit: 2022-01-17 | Discharge: 2022-01-17 | Payer: Commercial Managed Care - HMO

## 2022-01-17 ENCOUNTER — Ambulatory Visit: Admit: 2022-01-17 | Discharge: 2022-01-18 | Payer: Commercial Managed Care - HMO

## 2022-01-17 DIAGNOSIS — I2699 Other pulmonary embolism without acute cor pulmonale: Secondary | ICD-10-CM

## 2022-01-17 DIAGNOSIS — S069XAA Traumatic brain injury (HCC): Secondary | ICD-10-CM

## 2022-01-17 DIAGNOSIS — I639 Cerebral infarction, unspecified: Secondary | ICD-10-CM

## 2022-01-17 DIAGNOSIS — G40209 Localization-related (focal) (partial) symptomatic epilepsy and epileptic syndromes with complex partial seizures, not intractable, without status epilepticus: Secondary | ICD-10-CM

## 2022-01-17 DIAGNOSIS — G5131 Clonic hemifacial spasm, right: Secondary | ICD-10-CM

## 2022-01-17 DIAGNOSIS — B999 Unspecified infectious disease: Secondary | ICD-10-CM

## 2022-01-17 MED ORDER — LEVETIRACETAM 100 MG/ML PO SOLN
ORAL | 3 refills | 90.00000 days | Status: AC
Start: 2022-01-17 — End: ?

## 2022-01-17 MED ORDER — GABAPENTIN 250 MG/5 ML (5 ML) PO SOLN
3 mL | Freq: Three times a day (TID) | ORAL | 3 refills | Status: AC
Start: 2022-01-17 — End: ?

## 2022-01-17 NOTE — Assessment & Plan Note
Stable- continue gabapentin 3 mls TID

## 2022-01-17 NOTE — Assessment & Plan Note
Doing well.   Continue Keppra 400 mg BID
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
## 2022-01-19 ENCOUNTER — Encounter: Admit: 2022-01-19 | Discharge: 2022-01-19 | Payer: Commercial Managed Care - HMO

## 2022-01-31 ENCOUNTER — Encounter: Admit: 2022-01-31 | Discharge: 2022-01-31 | Payer: Commercial Managed Care - HMO

## 2022-02-01 ENCOUNTER — Encounter: Admit: 2022-02-01 | Discharge: 2022-02-01 | Payer: Commercial Managed Care - HMO

## 2022-02-02 ENCOUNTER — Ambulatory Visit: Admit: 2022-02-02 | Discharge: 2022-02-03 | Payer: Commercial Managed Care - HMO

## 2022-02-02 ENCOUNTER — Encounter: Admit: 2022-02-02 | Discharge: 2022-02-02 | Payer: Commercial Managed Care - HMO

## 2022-02-02 MED ORDER — INCOBOTULINUMTOXINA 100 UNIT IM SOLR
400 [IU] | Freq: Once | INTRAMUSCULAR | 0 refills | Status: CP
Start: 2022-02-02 — End: ?
  Administered 2022-02-02: 17:00:00 400 [IU] via INTRAMUSCULAR

## 2022-02-02 NOTE — Procedures
Neurorehabilitation Medicine - Procedure Visit  Physical Medicine & Rehabilitation  [facility]    Date of Service: 02/02/2022    Date of last botulinum toxin injection (any body site):10/27/2021  Complications or side effects with last injections: No  Current or recent fevers, chills, cough, difficulty breathing: No  Currently taking antibiotics: No  Currently taking any anti-coagulant medications: No  Other spasticity medications: Yes, oral baclofen mother is gradually weaning dose which does seem to help with alertness    Brief History/ Exam: 26 y.o. 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  No side effects from previous injections.  No significant changes requested to current injection regimen. Stable spastic tetraparesis on exam with left equinovarus position and right ankle plantar flexion contracture.  She uses a left thumbs up to communicate yes.  Mother has noted a few more instances of using the right arm for communication.  Mother is frustrated with lack of access to home PT or outpatient PT.  They did speak with ortho regarding surgical interventions for contracture and are considering those.     Vitals:    02/02/22 1055   BP: (P) 120/77   Pulse: (P) 71   SpO2: (P) 95%       PROCEDURE NOTE - Botulinum Toxin Injection    Procedure Date: 02/02/2022  Procedure: Botulinum Toxin Injections   Indication/ Diagnosis:   1. Spasticity  CHEMODENERVATION EXTREMITY MUSCLES        Prior to beginning the procedure, an opportunity to review the risks (pain, bleeding, infection, muscle weakness, allergic reaction, dysphagia/ dyspnea) and benefits (spasticity reduction, improved positioning/ ROM, decreased burden of care) was offered and the patient and/or parents consented to botulinum toxin injections of the right upper limb and bilateral lower limbs.   An electronic consent form was also completed and signed by the patient's DPOA as a part of the procedure visit as well.    Medication: botulinumtoxinA  Total Amount Reconstituted: 400 Units   Dilution: 1:4    The botulinum toxin was reconstituted in 16 mL of preservative free saline in a 100 Units to 4 mL ratio.  A time out was performed to locate and confirm the correct limb for injection.  The skin was prepped with alcohol prior to each injection.  Audible EMG guidance was used to localize the desired muscles.  The below listed amounts of botulinum toxin were injected into the below listed muscles via a 37 mm x 27 gauge needle electrode (unless otherwise specified) with aspiration prior to each injection.  The procedure was well tolerated with minimal bleeding that resolved after brief pressure was applied.    Muscles Injected:      Right Upper Limb  Right Pronator Teres 25 Units  Right Flexor Carpi Radialis 25 Units  Right Flexor Digitorum Superficialis 25 Units  Right Flexor Digitorum Profundus 25 Units    Right lower limb  Right medial gastrocnemius 33.3 units  Right lateral gastrocnemius 33.3 units  Right soleus 33.3 units  ?  Left lower limb  Left medial gastrocnemius 50 units  Left lateral gastrocnemius 50 units  Left soleus 50 units  Left posterior tibialis 50 units    Total Dose: 400 Units, no botulinum toxin was wasted       Administrations This Visit     INCObotulinum toxin A (XEOMIN) injection 400 Units     Admin Date  02/02/2022 Action  Given Dose  400 Units Route  Intramuscular Administered By  Macklyn Glandon,  Kandace Parkins, MD              PLAN:   -The patient will be eligible for repeat botulinum toxin injections in 3-4 months if clinically indicated.  -Follow-up in this clinic in 4-8 weeks as needed to assess efficacy of the above injections (ok via tele-health if needed).  -Continue home splinting, stretching, positioning, and therapy programs.      Su Monks, MD

## 2022-02-03 DIAGNOSIS — R252 Cramp and spasm: Secondary | ICD-10-CM

## 2022-02-05 ENCOUNTER — Encounter: Admit: 2022-02-05 | Discharge: 2022-02-05 | Payer: Commercial Managed Care - HMO

## 2022-02-07 ENCOUNTER — Encounter: Admit: 2022-02-07 | Discharge: 2022-02-07 | Payer: Commercial Managed Care - HMO

## 2022-02-09 ENCOUNTER — Encounter: Admit: 2022-02-09 | Discharge: 2022-02-09 | Payer: Commercial Managed Care - HMO

## 2022-02-16 ENCOUNTER — Encounter: Admit: 2022-02-16 | Discharge: 2022-02-16 | Payer: Commercial Managed Care - HMO

## 2022-02-23 ENCOUNTER — Encounter: Admit: 2022-02-23 | Discharge: 2022-02-23 | Payer: Commercial Managed Care - HMO

## 2022-02-25 ENCOUNTER — Encounter: Admit: 2022-02-25 | Discharge: 2022-02-25 | Payer: Commercial Managed Care - HMO
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
## 2022-03-31 ENCOUNTER — Encounter: Admit: 2022-03-31 | Discharge: 2022-03-31 | Payer: Commercial Managed Care - HMO

## 2022-04-19 ENCOUNTER — Encounter: Admit: 2022-04-19 | Discharge: 2022-04-19 | Payer: Commercial Managed Care - HMO

## 2022-04-19 NOTE — Telephone Encounter
Hi have Clide Deutscher speech pathologist calling on behalf of PT Sherri [name] MRN [id number] calling in regards to swallow therapy procedures.

## 2022-05-09 ENCOUNTER — Encounter: Admit: 2022-05-09 | Discharge: 2022-05-09 | Payer: Commercial Managed Care - HMO

## 2022-05-11 ENCOUNTER — Encounter: Admit: 2022-05-11 | Discharge: 2022-05-11 | Payer: MEDICARE

## 2022-05-11 ENCOUNTER — Ambulatory Visit: Admit: 2022-05-11 | Discharge: 2022-05-11 | Payer: MEDICARE

## 2022-05-11 DIAGNOSIS — I2699 Other pulmonary embolism without acute cor pulmonale: Secondary | ICD-10-CM

## 2022-05-11 DIAGNOSIS — S069XAA Traumatic brain injury (HCC): Secondary | ICD-10-CM

## 2022-05-11 DIAGNOSIS — B999 Unspecified infectious disease: Secondary | ICD-10-CM

## 2022-05-11 DIAGNOSIS — R252 Cramp and spasm: Secondary | ICD-10-CM

## 2022-05-11 DIAGNOSIS — I639 Cerebral infarction, unspecified: Secondary | ICD-10-CM

## 2022-05-11 MED ORDER — INCOBOTULINUMTOXINA 100 UNIT IM SOLR
400 [IU] | Freq: Once | INTRAMUSCULAR | 0 refills | Status: CP
Start: 2022-05-11 — End: ?
  Administered 2022-05-11: 21:00:00 400 [IU] via INTRAMUSCULAR

## 2022-05-11 NOTE — Patient Instructions
It was a pleasure seeing you in clinic today.    Rick Madisin Hasan RN, BSN  Clinical Nurse Coordinator  Physical Medicine & Rehab  Dr. Alexandra Arickx  [facility]  [facility]  [address]  [place],  [address]    Phone: [phone]  Fax [phone]  For scheduling, cancelling, or changing appointments [phone]    For prescription refills, please contact your pharmacy.  Please allow 2-3 days for refill requests to be filled.      For information on spinal conditions, please visit [email or web address]
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
## 2022-07-07 ENCOUNTER — Encounter: Admit: 2022-07-07 | Discharge: 2022-07-07 | Payer: 59

## 2022-07-19 ENCOUNTER — Encounter: Admit: 2022-07-19 | Discharge: 2022-07-19 | Payer: MEDICARE

## 2022-07-19 ENCOUNTER — Ambulatory Visit: Admit: 2022-07-19 | Discharge: 2022-07-19 | Payer: 59

## 2022-07-19 DIAGNOSIS — I639 Cerebral infarction, unspecified: Secondary | ICD-10-CM

## 2022-07-19 DIAGNOSIS — S069XAA Traumatic brain injury (HCC): Secondary | ICD-10-CM

## 2022-07-19 DIAGNOSIS — Z9889 Other specified postprocedural states: Secondary | ICD-10-CM

## 2022-07-19 DIAGNOSIS — I2699 Other pulmonary embolism without acute cor pulmonale: Secondary | ICD-10-CM

## 2022-07-19 DIAGNOSIS — B999 Unspecified infectious disease: Secondary | ICD-10-CM

## 2022-07-19 DIAGNOSIS — S0630AA Traumatic intraventricular hemorrhage (HCC): Secondary | ICD-10-CM

## 2022-07-19 DIAGNOSIS — S062X6D Diffuse traumatic brain injury with loss of consciousness greater than 24 hours without return to pre-existing conscious level with patient surviving, subsequent encounter: Secondary | ICD-10-CM

## 2022-07-19 DIAGNOSIS — R1312 Dysphagia, oropharyngeal phase: Secondary | ICD-10-CM

## 2022-07-19 NOTE — Progress Notes
Swallow study order faxed to [facility] as requested by the patients mother and Dr. Cassell Smiles.     Fax # [phone]    Delight Stare, RN

## 2022-07-21 ENCOUNTER — Encounter: Admit: 2022-07-21 | Discharge: 2022-07-21 | Payer: 59

## 2022-07-21 DIAGNOSIS — R252 Cramp and spasm: Secondary | ICD-10-CM

## 2022-07-22 ENCOUNTER — Encounter: Admit: 2022-07-22 | Discharge: 2022-07-22 | Payer: 59

## 2022-07-26 ENCOUNTER — Encounter: Admit: 2022-07-26 | Discharge: 2022-07-26 | Payer: 59

## 2022-07-26 IMAGING — CR [ID]
2 series · 2 of 2 positions shown · non-contrast
Comparison: none

[chest ap]
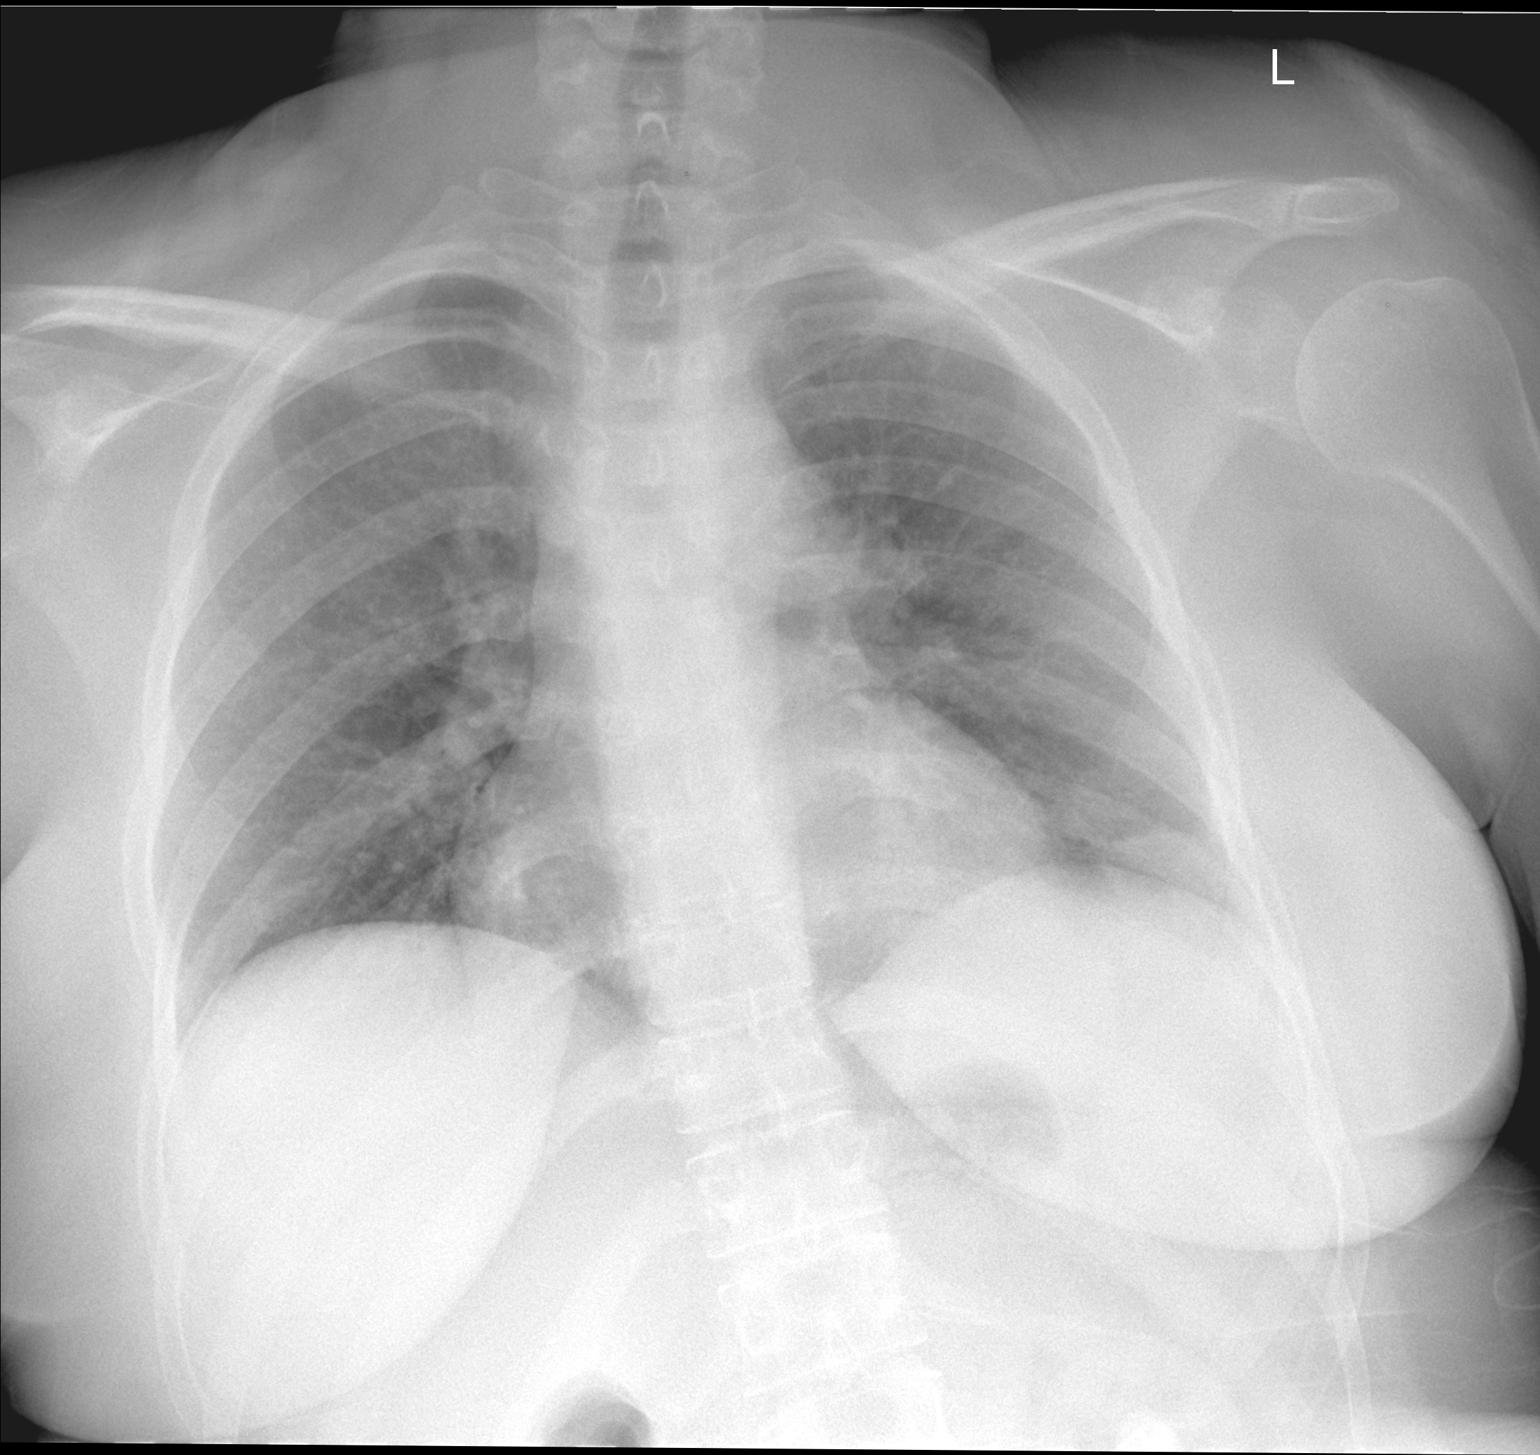

[chest lat]
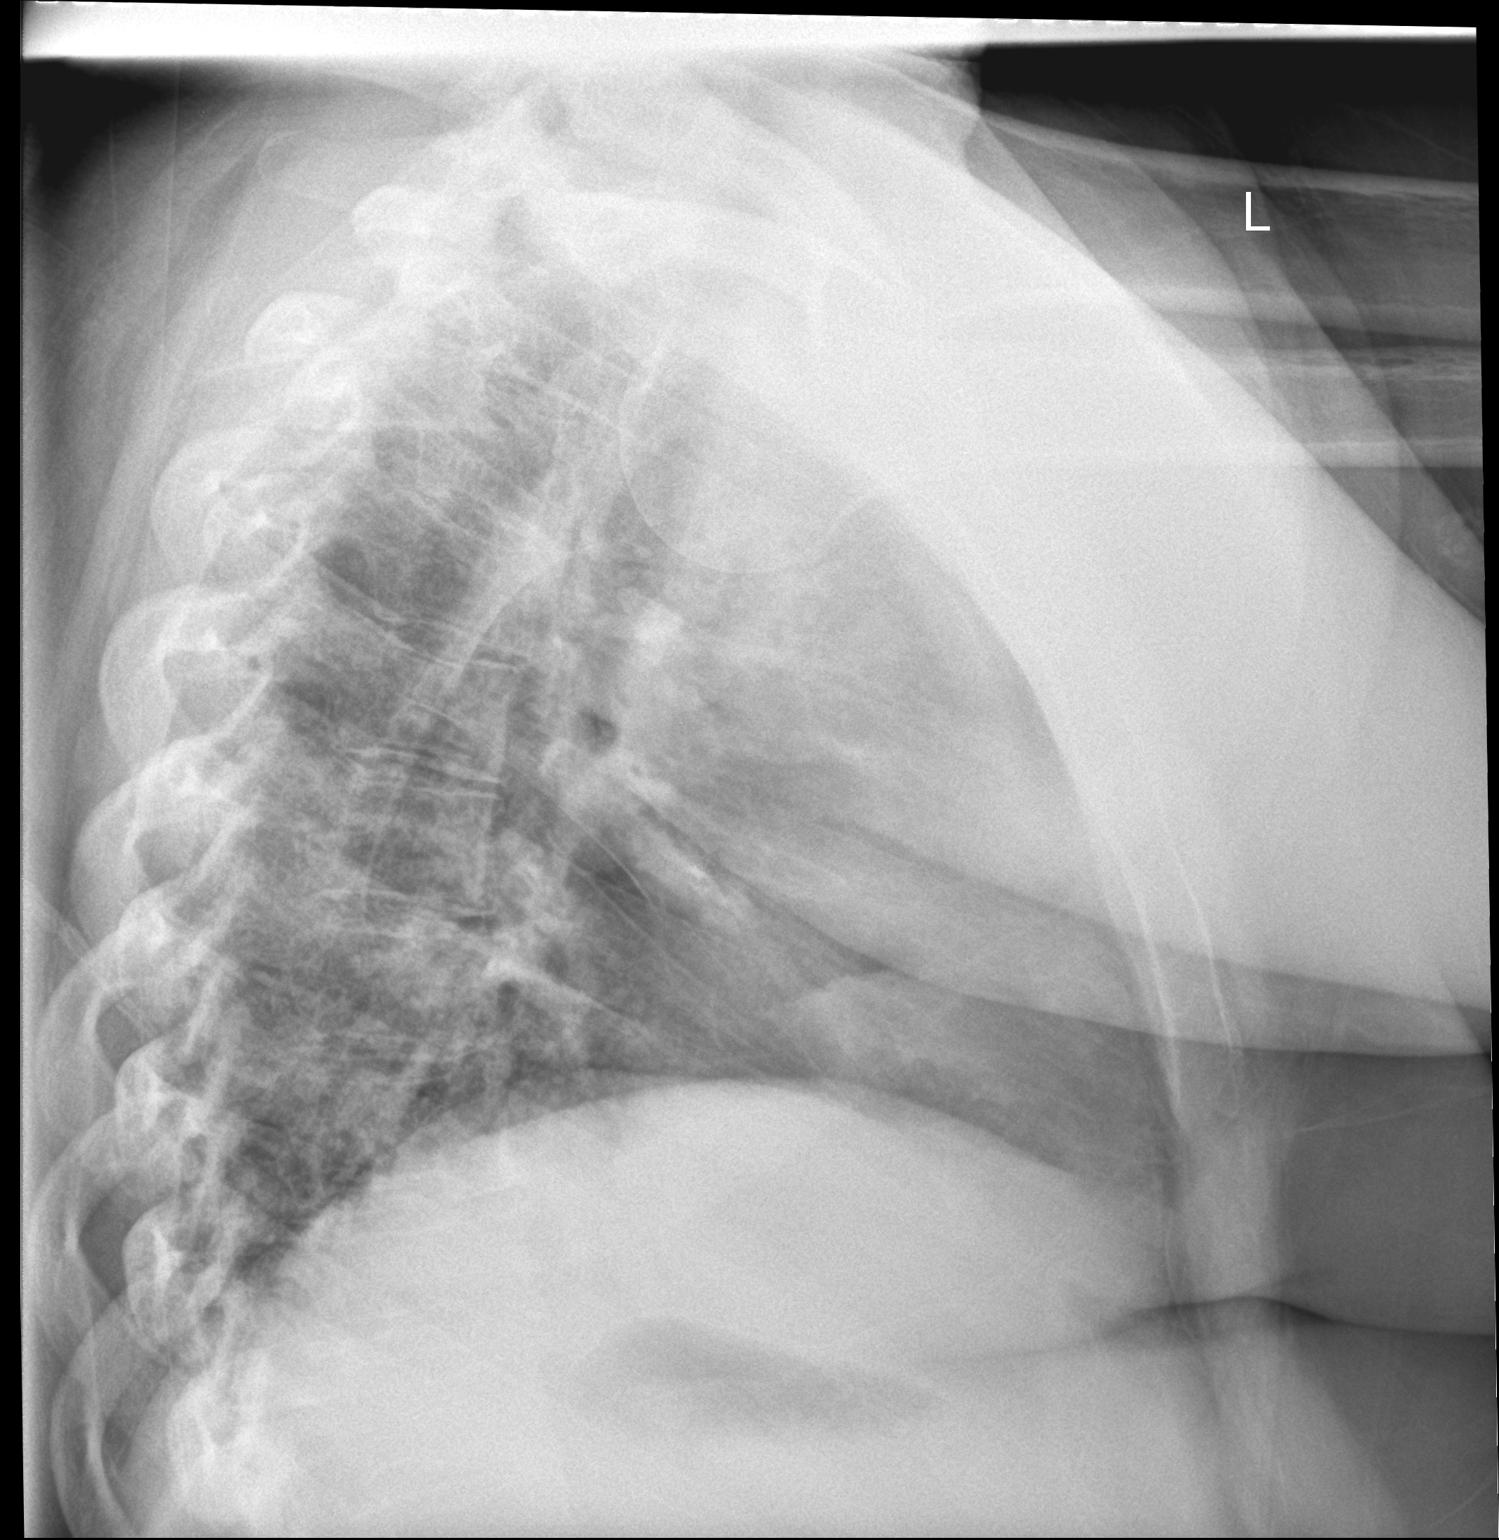

[2 of 2 positions shown; findings below may reference images not displayed]

DIAGNOSTIC STUDIES

EXAM

XR chest 2V

INDICATION

chronic cough
rsv back in March 2021. covid in  May 2021. coughing up fluid. images best obtained due to pt
condition

TECHNIQUE

PA and lateral views of the chest.

COMPARISONS

April 18, 2019

FINDINGS

Cardiac silhouette is within normal limits. Lungs are clear. Bony thorax is unchanged.

IMPRESSION

No acute infiltrates throughout the chest.

Tech Notes:

rsv back in March 2021. covid in  May 2021. coughing up fluid. images best obtained due to pt
condition

## 2022-07-28 ENCOUNTER — Encounter: Admit: 2022-07-28 | Discharge: 2022-07-28 | Payer: 59

## 2022-08-02 ENCOUNTER — Encounter: Admit: 2022-08-02 | Discharge: 2022-08-02 | Payer: 59

## 2022-08-05 ENCOUNTER — Encounter: Admit: 2022-08-05 | Discharge: 2022-08-05 | Payer: 59

## 2022-08-10 ENCOUNTER — Encounter: Admit: 2022-08-10 | Discharge: 2022-08-10 | Payer: 59

## 2022-08-10 ENCOUNTER — Ambulatory Visit: Admit: 2022-08-10 | Discharge: 2022-08-11 | Payer: MEDICARE

## 2022-08-10 DIAGNOSIS — I2699 Other pulmonary embolism without acute cor pulmonale: Secondary | ICD-10-CM

## 2022-08-10 DIAGNOSIS — S069XAA Traumatic brain injury (HCC): Secondary | ICD-10-CM

## 2022-08-10 DIAGNOSIS — B999 Unspecified infectious disease: Secondary | ICD-10-CM

## 2022-08-10 DIAGNOSIS — I639 Cerebral infarction, unspecified: Secondary | ICD-10-CM

## 2022-08-10 DIAGNOSIS — R252 Cramp and spasm: Secondary | ICD-10-CM

## 2022-08-10 MED ORDER — ONABOTULINUMTOXINA 100 UNIT IJ SOLR
400 [IU] | Freq: Once | INTRAMUSCULAR | 0 refills | Status: CP
Start: 2022-08-10 — End: ?
  Administered 2022-08-10: 19:00:00 400 [IU] via INTRAMUSCULAR

## 2022-08-10 MED ORDER — INCOBOTULINUMTOXINA 100 UNIT IM SOLR
400 [IU] | Freq: Once | INTRAMUSCULAR | 0 refills | Status: AC
Start: 2022-08-10 — End: ?
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
## 2022-08-10 NOTE — Patient Instructions
It was a pleasure seeing you in clinic today.    Rick Daliah Chaudoin RN, BSN  Clinical Nurse Coordinator  Physical Medicine & Rehab  Dr. Alexandra Arickx  [facility]  [facility]  [address]  [place], [place] [address]    Phone: [phone]  Fax [phone]  For scheduling, cancelling, or changing appointments [phone]    For prescription refills, please contact your pharmacy.  Please allow 2-3 days for refill requests to be filled.      For information on spinal conditions, please visit [email or web address]

## 2022-08-11 ENCOUNTER — Encounter: Admit: 2022-08-11 | Discharge: 2022-08-11 | Payer: 59

## 2022-08-16 ENCOUNTER — Encounter: Admit: 2022-08-16 | Discharge: 2022-08-16 | Payer: 59

## 2022-08-17 ENCOUNTER — Encounter: Admit: 2022-08-17 | Discharge: 2022-08-17 | Payer: 59

## 2022-08-17 ENCOUNTER — Ambulatory Visit: Admit: 2022-08-17 | Discharge: 2022-08-17 | Payer: MEDICARE

## 2022-08-17 DIAGNOSIS — G40209 Localization-related (focal) (partial) symptomatic epilepsy and epileptic syndromes with complex partial seizures, not intractable, without status epilepticus: Secondary | ICD-10-CM

## 2022-08-17 NOTE — Progress Notes
Routine 25 minute outpatient EEG completed without complications. Recording began at 15:11:09.    Paperwork including the patient history and EEG information sheet was provided to the patient.     The test was explained to the patient in detail and all questions were answered regarding the performing of the EEG.     All electrodes were below 5,000kOhm and recording properly. Photic Stimulation was performed. Hyperventilation was not performed due to current ACNS guidelines.    After the test the scalp was inspected for any skin breakdown and was cleaned with warm water and  a washcloth. Education was given to the patient about proper hair care after the EEG.    The patient was educated to check in MyChart or contact the physician's office in approximately 2 weeks regarding the results of their EEG.

## 2022-08-29 ENCOUNTER — Encounter: Admit: 2022-08-29 | Discharge: 2022-08-29 | Payer: 59

## 2022-08-29 DIAGNOSIS — R1312 Dysphagia, oropharyngeal phase: Secondary | ICD-10-CM

## 2022-08-29 NOTE — Telephone Encounter
Incoming call received from [place], Music therapist at [facility] informing this nurse they never received the swallow study order. This nurse informed Victorino Dike the order was faxed on 3/19 and provided her with fax number it was sent to. Victorino Dike requested this nurse fax the swallow study order to [phone]. This nurse confirmed this information and informed Victorino Dike I would do this now and asked if she was also needing a new speech therapy referral. Victorino Dike confirmed [facility] referral will expire 5/9 so if we would like them to continue seeing her, they will need an order faxed to [phone]. This nurse confirmed this information.     Mingo Amber, RN

## 2022-09-23 ENCOUNTER — Encounter: Admit: 2022-09-23 | Discharge: 2022-09-23 | Payer: 59

## 2022-10-10 ENCOUNTER — Encounter: Admit: 2022-10-10 | Discharge: 2022-10-10 | Payer: 59

## 2022-10-11 ENCOUNTER — Encounter: Admit: 2022-10-11 | Discharge: 2022-10-11 | Payer: 59

## 2022-10-18 ENCOUNTER — Encounter: Admit: 2022-10-18 | Discharge: 2022-10-18 | Payer: 59

## 2022-10-20 ENCOUNTER — Encounter: Admit: 2022-10-20 | Discharge: 2022-10-20 | Payer: 59

## 2022-11-07 ENCOUNTER — Encounter: Admit: 2022-11-07 | Discharge: 2022-11-07 | Payer: 59

## 2022-11-10 ENCOUNTER — Encounter: Admit: 2022-11-10 | Discharge: 2022-11-10 | Payer: 59

## 2022-11-17 ENCOUNTER — Encounter: Admit: 2022-11-17 | Discharge: 2022-11-17 | Payer: 59

## 2022-11-23 ENCOUNTER — Encounter: Admit: 2022-11-23 | Discharge: 2022-11-23 | Payer: 59

## 2022-11-23 ENCOUNTER — Ambulatory Visit: Admit: 2022-11-23 | Discharge: 2022-11-24 | Payer: MEDICARE

## 2022-11-23 MED ORDER — INCOBOTULINUMTOXINA 100 UNIT IM SOLR
400 [IU] | Freq: Once | INTRAMUSCULAR | 0 refills | Status: CP
Start: 2022-11-23 — End: ?
  Administered 2022-11-23: 19:00:00 400 [IU] via INTRAMUSCULAR
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
## 2022-11-23 NOTE — Patient Instructions
It was a pleasure seeing you in clinic today.    Rick Hristopher Missildine RN, BSN  Clinical Nurse Coordinator  Physical Medicine & Rehab  Dr. Alexandra Arickx  [facility]  [facility]  [address]  [place],  [address]    Phone: [phone]  Fax [phone]  For scheduling, cancelling, or changing appointments [phone]    For prescription refills, please contact your pharmacy.  Please allow 2-3 days for refill requests to be filled.      For information on spinal conditions, please visit [email or web address]

## 2022-11-24 DIAGNOSIS — R252 Cramp and spasm: Secondary | ICD-10-CM

## 2022-12-06 ENCOUNTER — Encounter: Admit: 2022-12-06 | Discharge: 2022-12-06 | Payer: 59

## 2022-12-20 ENCOUNTER — Encounter: Admit: 2022-12-20 | Discharge: 2022-12-20 | Payer: 59

## 2022-12-21 ENCOUNTER — Encounter: Admit: 2022-12-21 | Discharge: 2022-12-21 | Payer: 59

## 2023-01-10 ENCOUNTER — Encounter: Admit: 2023-01-10 | Discharge: 2023-01-10 | Payer: 59

## 2023-01-19 ENCOUNTER — Encounter: Admit: 2023-01-19 | Discharge: 2023-01-19 | Payer: 59

## 2023-01-19 ENCOUNTER — Ambulatory Visit: Admit: 2023-01-19 | Discharge: 2023-01-20 | Payer: MEDICARE

## 2023-01-19 DIAGNOSIS — G40209 Localization-related (focal) (partial) symptomatic epilepsy and epileptic syndromes with complex partial seizures, not intractable, without status epilepticus: Secondary | ICD-10-CM

## 2023-01-19 DIAGNOSIS — G5133 Clonic hemifacial spasm, bilateral: Secondary | ICD-10-CM

## 2023-01-19 MED ORDER — GABAPENTIN 250 MG/5 ML (5 ML) PO SOLN
3 mL | Freq: Three times a day (TID) | ORAL | 3 refills | Status: AC
Start: 2023-01-19 — End: ?

## 2023-01-19 MED ORDER — LEVETIRACETAM 100 MG/ML PO SOLN
ORAL | 3 refills | 90.00000 days | Status: AC
Start: 2023-01-19 — End: ?

## 2023-01-19 NOTE — Assessment & Plan Note
Seizures are under good control. Continue Keppra 400 mg BID

## 2023-01-19 NOTE — Assessment & Plan Note
Stable, although concern that gabapentin may be causing some drowsiness. Will reduce gabapentin to 2 mls TID and see if it helps with excessive sleepiness.
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
## 2023-01-27 ENCOUNTER — Encounter: Admit: 2023-01-27 | Discharge: 2023-01-27 | Payer: 59

## 2023-02-02 ENCOUNTER — Encounter: Admit: 2023-02-02 | Discharge: 2023-02-02 | Payer: 59

## 2023-02-06 ENCOUNTER — Encounter: Admit: 2023-02-06 | Discharge: 2023-02-06 | Payer: 59

## 2023-02-06 DIAGNOSIS — R252 Cramp and spasm: Secondary | ICD-10-CM

## 2023-02-09 ENCOUNTER — Encounter: Admit: 2023-02-09 | Discharge: 2023-02-09 | Payer: 59

## 2023-02-20 ENCOUNTER — Encounter: Admit: 2023-02-20 | Discharge: 2023-02-20 | Payer: 59

## 2023-02-21 ENCOUNTER — Ambulatory Visit: Admit: 2023-02-21 | Discharge: 2023-02-22 | Payer: MEDICARE

## 2023-02-21 ENCOUNTER — Encounter: Admit: 2023-02-21 | Discharge: 2023-02-21 | Payer: MEDICARE

## 2023-02-21 DIAGNOSIS — I639 Cerebral infarction, unspecified: Secondary | ICD-10-CM

## 2023-02-21 DIAGNOSIS — S069XAA Traumatic brain injury (HCC): Secondary | ICD-10-CM

## 2023-02-21 DIAGNOSIS — I2699 Other pulmonary embolism without acute cor pulmonale: Secondary | ICD-10-CM

## 2023-02-21 DIAGNOSIS — B999 Unspecified infectious disease: Secondary | ICD-10-CM

## 2023-02-21 MED ORDER — INCOBOTULINUMTOXINA 100 UNIT IM SOLR
400 [IU] | Freq: Once | 0 refills | Status: CP | PRN
Start: 2023-02-21 — End: ?

## 2023-02-21 NOTE — Progress Notes
Neurorehabilitation Medicine - Procedure Visit  Physical Medicine & Rehabilitation  [facility]    Date of Service: 02/21/2023    Date of last botulinum toxin injection (any body site): 11/23/2022  Complications or side effects with last injections: No  Current or recent fevers, chills, cough, difficulty breathing: No (stable difficulty clearing secretions)  Currently taking antibiotics: No  Currently taking any anti-coagulant medications: No  Other spasticity medications: No, successfully weaned off of oral baclofen    Brief History/ Exam: 27 y.o. 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  No side effects from previous injections.  No significant changes requested to current injection regimen. Stable spastic tetraparesis on exam with left equinovarus position and right ankle plantar flexion contracture.  She uses a left thumbs up to communicate yes.  Mother has noted a few more instances of using the right arm for communication even since our last visit where this was also noted.       No changes in tone as baclofen dosing reduced.  Now off of the medication entirely.  May have seen more alertness since this change.  Working on tilt table or standing frame after fabrication of new AFOs, not interested in surgical tendon releases at this time.     Vitals:    02/21/23 1316   BP: 115/79   Pulse: 79       PROCEDURE NOTE - Botulinum Toxin Injection    Procedure Date: 02/21/2023  Procedure: Botulinum Toxin Injections   Indication/ Diagnosis:   1. Spasticity  CHEMODENERVATION EXTREMITY MUSCLES        Botox Extremity Muscles    Date/Time: 02/21/2023 1:30 PM    Performed by: Brynda Greathouse, MD  Authorized by: Brynda Greathouse, MD  Consent obtained: written  Consent given by: patient  Procedural risks discussed:  Bleeding, infection and pain  Risks and benefits: risks and benefits discussed  Patient states understanding of procedure being performed, patient's understanding of procedure matches consent, procedure consent matches procedure scheduled, relevant documents present and verified. Patient identity confirmed, immediately prior to the procedure a time out was called. If applicable, test results available and properly labeled, site marked, imaging studies available, required blood products, implants, devices and special equipment available.      Procedure:  1st Extremity Location: Right Arm  Number of Muscles: 1-4  Additional Extremity Location: Right Leg  Number of Muscles: 1-4  Additional Extremity Location: Left Leg  Number of Muscles: 1-4  EMG Guidance Charge: 95874 - EMG GUIDANCE  Medications administered: 400 Units INCObotulinum toxin A 100 unit  Total Units Used: 400  Total Units Wasted: 0            Prior to beginning the procedure, an opportunity to review the risks (pain, bleeding, infection, muscle weakness, allergic reaction, dysphagia/ dyspnea) and benefits (spasticity reduction, improved positioning/ ROM, decreased burden of care) was offered and the patient and/or parents consented to botulinum toxin injections of the right upper limb and bilateral lower limbs.   An electronic consent form was also completed and signed by the patient's DPOA as a part of the procedure visit as well.    Medication: incobotulinum toxin A(Xeomin)  Total Amount Reconstituted: 400 Units   Dilution: 1:4    The botulinum toxin was reconstituted in 16 mL of preservative free saline in a 100 Units to 4 mL ratio.  A time out was performed to locate and  confirm the correct limb for injection.  The skin was prepped with alcohol prior to each injection.  Audible EMG guidance was used to localize the desired muscles. Electrical stimulation was used to stimulate the Flexor Hallucis longus and Tibialis posterior. The below listed amounts of botulinum toxin were injected into the below listed muscles via a 37 mm x 27 gauge needle electrode (unless otherwise specified) with aspiration prior to each injection.  The procedure was well tolerated with minimal bleeding that resolved after brief pressure was applied.    Muscles Injected:      Right Upper Limb  Right Flexor Digitorum Superficialis 50 Units  Right Flexor Digitorum Profundus 50 Units    Right lower limb  Right medial gastrocnemius 33.3 units  Right lateral gastrocnemius 33.3 units  Right soleus 33.3 units     Left lower limb  Left medial gastrocnemius 50 units  Left lateral gastrocnemius 50 units  Left soleus 50 units  Left posterior tibialis 50 units    Total Dose: 400 Units, no botulinum toxin was wasted           PLAN:   -The patient will be eligible for repeat botulinum toxin injections in 3-4 months if clinically indicated.  -Follow-up in this clinic in 4-8 weeks as needed to assess efficacy of the above injections (ok via tele-health if needed).  -Continue home splinting, stretching, positioning, and therapy programs.    Brynda Greathouse, MD

## 2023-02-22 DIAGNOSIS — R252 Cramp and spasm: Secondary | ICD-10-CM

## 2023-02-23 ENCOUNTER — Encounter: Admit: 2023-02-23 | Discharge: 2023-02-23 | Payer: MEDICARE

## 2023-03-17 ENCOUNTER — Encounter: Admit: 2023-03-17 | Discharge: 2023-03-17 | Payer: MEDICARE

## 2023-03-27 ENCOUNTER — Encounter: Admit: 2023-03-27 | Discharge: 2023-03-27 | Payer: MEDICARE

## 2023-05-17 ENCOUNTER — Encounter: Admit: 2023-05-17 | Discharge: 2023-05-17 | Payer: MEDICARE

## 2023-05-17 NOTE — Telephone Encounter
Plan of care received for certification by Dr. Glendon Axe. Order signed by Dr. Glendon Axe and faxed to [facility].     Mingo Amber, RN

## 2023-06-09 ENCOUNTER — Encounter: Admit: 2023-06-09 | Discharge: 2023-06-09 | Payer: MEDICARE

## 2023-07-18 ENCOUNTER — Encounter: Admit: 2023-07-18 | Discharge: 2023-07-18 | Payer: MEDICARE

## 2023-07-18 ENCOUNTER — Ambulatory Visit: Admit: 2023-07-18 | Discharge: 2023-07-19 | Payer: PRIVATE HEALTH INSURANCE

## 2023-07-18 DIAGNOSIS — Z9889 Other specified postprocedural states: Secondary | ICD-10-CM

## 2023-07-18 DIAGNOSIS — S098XXA Other specified injuries of head, initial encounter: Secondary | ICD-10-CM

## 2023-07-18 DIAGNOSIS — R1312 Dysphagia, oropharyngeal phase: Secondary | ICD-10-CM

## 2023-07-18 DIAGNOSIS — S062X6D Diffuse traumatic brain injury with loss of consciousness greater than 24 hours without return to pre-existing conscious level with patient surviving, subsequent encounter: Secondary | ICD-10-CM

## 2023-07-18 DIAGNOSIS — S069X6D Unspecified intracranial injury with loss of consciousness greater than 24 hours without return to pre-existing conscious level with patient surviving, subsequent encounter: Secondary | ICD-10-CM

## 2023-07-21 ENCOUNTER — Encounter: Admit: 2023-07-21 | Discharge: 2023-07-21 | Payer: MEDICARE

## 2023-08-01 ENCOUNTER — Encounter: Admit: 2023-08-01 | Discharge: 2023-08-01

## 2023-08-01 DIAGNOSIS — R252 Cramp and spasm: Secondary | ICD-10-CM

## 2023-08-21 ENCOUNTER — Encounter: Admit: 2023-08-21 | Discharge: 2023-08-21 | Payer: MEDICARE

## 2023-08-22 ENCOUNTER — Encounter: Admit: 2023-08-22 | Discharge: 2023-08-22 | Payer: MEDICARE

## 2023-08-30 ENCOUNTER — Encounter: Admit: 2023-08-30 | Discharge: 2023-08-30 | Payer: MEDICARE

## 2023-09-04 ENCOUNTER — Encounter: Admit: 2023-09-04 | Discharge: 2023-09-04 | Payer: MEDICARE

## 2023-09-06 ENCOUNTER — Encounter: Admit: 2023-09-06 | Discharge: 2023-09-06 | Payer: MEDICARE

## 2023-09-15 ENCOUNTER — Encounter: Admit: 2023-09-15 | Discharge: 2023-09-15 | Payer: MEDICARE

## 2023-09-15 NOTE — Patient Instructions
It was a pleasure seeing you in clinic today.    Elwyn Lade RN, BSN  Clinical Nurse Coordinator  Physical Medicine & Rehab  Dr. Odie Sera  [facility]  [address]  [place], [place] [address]    Phone: [phone]  Fax [phone]  For scheduling, cancelling, or changing appointments [phone]    For prescription refills, please contact your pharmacy.  Please allow 2-3 days for refill requests to be filled.      For information on spinal conditions, please visit [email or web address]

## 2023-09-20 ENCOUNTER — Encounter: Admit: 2023-09-20 | Discharge: 2023-09-20 | Payer: PRIVATE HEALTH INSURANCE

## 2023-09-20 ENCOUNTER — Ambulatory Visit: Admit: 2023-09-20 | Discharge: 2023-09-21 | Payer: PRIVATE HEALTH INSURANCE

## 2023-09-20 DIAGNOSIS — R252 Cramp and spasm: Secondary | ICD-10-CM

## 2023-09-20 MED ORDER — INCOBOTULINUMTOXINA 100 UNIT IM SOLR
400 [IU] | Freq: Once | INTRAMUSCULAR | 0 refills | Status: CP
Start: 2023-09-20 — End: ?
  Administered 2023-09-20: 18:00:00 350 [IU] via INTRAMUSCULAR

## 2023-09-20 NOTE — Procedures
 Neurorehabilitation Medicine - Procedure Visit  Physical Medicine & Rehabilitation  [facility]    Date of Service: 09/20/2023    Date of last botulinum toxin injection (any body site):02/21/2023  Complications or side effects with last injections: No  Current or recent fevers, chills, cough, difficulty breathing: No (stable difficulty clearing secretions)  Currently taking antibiotics: No  Currently taking any anti-coagulant medications: No  Other spasticity medications: No, successfully weaned off of oral baclofen    Brief History/ Exam: 28 y.o. 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  No side effects from previous injections.  No significant changes requested to current injection regimen. Stable spastic tetraparesis on exam with bilateral ankle plantar flexion contractures.  She uses a left thumbs up to communicate yes.  They see more benefit with arm injections than they do with leg injections.         Vitals:    09/20/23 1159   BP: 102/74   Pulse: 78   SpO2: 95%       PROCEDURE NOTE - Botulinum Toxin Injection    Procedure Date: 09/20/2023  Procedure: Botulinum Toxin Injections   Indication/ Diagnosis:   1. Spasticity  CHEMODENERVATION EXTREMITY MUSCLES    CHEMODENERVATION EXTREMITY MUSCLES    CHEMODENERVATION EXTREMITY MUSCLES        Prior to beginning the procedure, an opportunity to review the risks (pain, bleeding, infection, muscle weakness, allergic reaction, dysphagia/ dyspnea) and benefits (spasticity reduction, improved positioning/ ROM, decreased burden of care) was offered and the patient and/or parents consented to botulinum toxin injections of the right upper limb and bilateral lower limbs.       Medication: botulinumtoxinA  Total Amount Reconstituted: 400 Units   Dilution: 1:4    The botulinum toxin was reconstituted in 16 mL of preservative free saline in a 100 Units to 4 mL ratio.  A time out was performed to locate and confirm the correct limb for injection.  The skin was prepped with alcohol prior to each injection.  Audible EMG guidance was used to localize the desired muscles. The below listed amounts of botulinum toxin were injected into the below listed muscles via a 37 mm x 27 gauge needle electrode (unless otherwise specified) with aspiration prior to each injection.  The procedure was well tolerated with minimal bleeding that resolved after brief pressure was applied.    Muscles Injected:      Right Upper Limb  Right Flexor Digitorum Superficialis 50 Units  Right Flexor Digitorum Profundus 50 Units    Right lower limb  Right medial gastrocnemius 33.3 units  Right lateral gastrocnemius 33.3 units  Right soleus 33.3 units     Left lower limb  Left medial gastrocnemius 50 units  Left lateral gastrocnemius 50 units  Left soleus 50 units    Total Dose: 350 Units, 50 Units botulinum toxin was wasted in the presence of Marcus Sewer, RN    Administrations This Visit       INCObotulinum toxin A Marye Smoker) injection 400 Units       Admin Date  09/20/2023 Action  Given Dose  350 Units Route  Intramuscular Documented By  Alphia Jasmine, RN                     PLAN:   -The patient will be eligible for repeat botulinum toxin injections in 3-4 months if clinically indicated.  -Follow-up in this clinic in  4-8 weeks as needed to assess efficacy of the above injections (ok via tele-health if needed).  -Continue home splinting, stretching, positioning, and therapy programs.  -We reviewed the option for surgical approaches to improve what appear to be fixed bilateral APF contractures.    Reginold Capron, MD

## 2023-11-15 ENCOUNTER — Encounter: Admit: 2023-11-15 | Discharge: 2023-11-15 | Payer: PRIVATE HEALTH INSURANCE

## 2023-11-15 MED ORDER — GABAPENTIN 250 MG/5 ML (5 ML) PO SOLN
3 mL | Freq: Three times a day (TID) | ORAL | 3 refills | 30.00000 days | Status: AC
Start: 2023-11-15 — End: ?

## 2023-11-15 NOTE — Telephone Encounter
 Last Office Visit: 01/19/2023  Next Office Visit: 01/22/2024    Plan:  gabapentin  3 mls TID.     Refilled per protocol

## 2023-11-16 ENCOUNTER — Encounter: Admit: 2023-11-16 | Discharge: 2023-11-16 | Payer: PRIVATE HEALTH INSURANCE

## 2023-12-04 ENCOUNTER — Encounter: Admit: 2023-12-04 | Discharge: 2023-12-04 | Payer: PRIVATE HEALTH INSURANCE

## 2023-12-05 ENCOUNTER — Encounter: Admit: 2023-12-05 | Discharge: 2023-12-05 | Payer: PRIVATE HEALTH INSURANCE

## 2023-12-05 DIAGNOSIS — R1312 Dysphagia, oropharyngeal phase: Principal | ICD-10-CM

## 2023-12-05 DIAGNOSIS — S098XXA Other specified injuries of head, initial encounter: Secondary | ICD-10-CM

## 2023-12-05 DIAGNOSIS — Z9889 Other specified postprocedural states: Secondary | ICD-10-CM

## 2023-12-05 NOTE — Telephone Encounter
 Incoming call received from [place] with [facility]. Fernandez Kenley informed RN that referrals are good for one year and [facility] referral is going to expire in 3 days. RN discussed with Charmaine that we had sent a new referral back in March of this year, but I can get another one placed and sent to them. Charmaine confirmed this information and provided RN with fax number of [phone].

## 2023-12-06 ENCOUNTER — Encounter: Admit: 2023-12-06 | Discharge: 2023-12-06 | Payer: PRIVATE HEALTH INSURANCE

## 2023-12-06 NOTE — Telephone Encounter
 RN called and spoke to Donnice with [employer] regarding need for letter of medical necessity for patients speech therapy services. Per Donnice, the plan requires medical necessity review after 8 weeks of continued therapy (for speech, occupation and physical therapy) so we will need to complete a medical necessity form and fax it back to them with therapy plan of care and progress notes that indicates improvement. Office fax number provided to [facility] for form to be faxed to us  for completion. Matthew informed RN once this form is completed, we should fax this back to [phone]    Charmaine Brunswick, RN

## 2023-12-08 ENCOUNTER — Encounter: Admit: 2023-12-08 | Discharge: 2023-12-08 | Payer: PRIVATE HEALTH INSURANCE

## 2023-12-13 ENCOUNTER — Encounter: Admit: 2023-12-13 | Discharge: 2023-12-13 | Payer: PRIVATE HEALTH INSURANCE

## 2023-12-15 ENCOUNTER — Encounter: Admit: 2023-12-15 | Discharge: 2023-12-15 | Payer: PRIVATE HEALTH INSURANCE

## 2023-12-15 NOTE — Patient Instructions
 It was a pleasure to see you in clinic today.    Elwyn Lade RN, BSN  Clinical Nurse Coordinator  [facility]  [facility] [place] [facility]  [facility] [address] [place], [place] [address]  [phone]  Scheduling to make, cancel or change appointment [phone]    For prescription refills, please contact your pharmacy. Please allow 2-3 days for refill requests to be filled.    For more information on spine questions, please visit [email or web address]

## 2023-12-20 ENCOUNTER — Encounter: Admit: 2023-12-20 | Discharge: 2023-12-20 | Payer: PRIVATE HEALTH INSURANCE

## 2023-12-20 ENCOUNTER — Ambulatory Visit: Admit: 2023-12-20 | Discharge: 2023-12-21 | Payer: PRIVATE HEALTH INSURANCE

## 2023-12-20 DIAGNOSIS — G8250 Quadriplegia, unspecified: Secondary | ICD-10-CM

## 2023-12-20 DIAGNOSIS — S14104D Unspecified injury at C4 level of cervical spinal cord, subsequent encounter: Secondary | ICD-10-CM

## 2023-12-20 DIAGNOSIS — S069XAS Traumatic brain injury, with unknown loss of consciousness status, sequela: Secondary | ICD-10-CM

## 2023-12-20 DIAGNOSIS — R252 Cramp and spasm: Principal | ICD-10-CM

## 2023-12-20 DIAGNOSIS — S065X6S Traumatic subdural hemorrhage with loss of consciousness greater than 24 hours without return to pre-existing conscious level with patient surviving, sequela: Secondary | ICD-10-CM

## 2023-12-20 MED ORDER — INCOBOTULINUMTOXINA 100 UNIT IM SOLR
400 [IU] | Freq: Once | INTRAMUSCULAR | 0 refills | Status: CP
Start: 2023-12-20 — End: ?
  Administered 2023-12-20: 19:00:00 400 [IU] via INTRAMUSCULAR

## 2023-12-20 NOTE — Procedures
 Neurorehabilitation Medicine - Procedure Visit  Physical Medicine & Rehabilitation  [facility]    Date of Service: 12/20/2023    Date of last botulinum toxin injection (any body site):09/20/2023  Complications or side effects with last injections: No  Current or recent fevers, chills, cough, difficulty breathing: No (stable difficulty clearing secretions)  Currently taking antibiotics: No  Currently taking any anti-coagulant medications: No  Other spasticity medications: No, successfully weaned off of oral baclofen    Brief History/ Exam: 28 y.o. 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  No side effects from previous injections.  No significant changes requested to current injection regimen. Stable spastic tetraparesis on exam with bilateral ankle plantar flexion contractures.  She uses a left thumbs up to communicate yes.  They see more benefit with arm injections than they do with leg injections.  She is doing therapy with [facility] and using a standing frame / tilt table about once weekly.         Vitals:    12/20/23 1327   BP: 108/68   Pulse: 64   SpO2: 96%       PROCEDURE NOTE - Botulinum Toxin Injection    Procedure Date: 12/20/2023  Procedure: Botulinum Toxin Injections   Indication/ Diagnosis:   1. Spasticity  CHEMODENERVATION EXTREMITY MUSCLES    CHEMODENERVATION EXTREMITY MUSCLES      2. Unspecified injury at C4 level of cervical spinal cord, subsequent encounter (CMS-HCC)        3. 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 Traumatic brain injury, with unknown loss of consciousness status, sequela        5. Tetraplegia (CMS-HCC)          Prior to beginning the procedure, an opportunity to review the risks (pain, bleeding, infection, muscle weakness, allergic reaction, dysphagia/ dyspnea) and benefits (spasticity reduction, improved positioning/ ROM, decreased burden of care) was offered and the patient and/or parents consented to botulinum toxin injections of the right upper limb and bilateral lower limbs.       Medication: botulinumtoxinA  Total Amount Reconstituted: 400 Units   Dilution: 1:4    The botulinum toxin was reconstituted in 16 mL of preservative free saline in a 100 Units to 4 mL ratio.  A time out was performed to locate and confirm the correct limb for injection.  The skin was prepped with alcohol prior to each injection.  Audible EMG guidance was used to localize the desired muscles. The below listed amounts of botulinum toxin were injected into the below listed muscles via a 37 mm x 27 gauge needle electrode (unless otherwise specified) with aspiration prior to each injection.  The procedure was well tolerated with minimal bleeding that resolved after brief pressure was applied.    Muscles Injected:      Right Upper Limb  Right Flexor Digitorum Superficialis 50 Units  Right Flexor Digitorum Profundus 50 Units    Right lower limb  Right medial gastrocnemius 50 units  Right lateral gastrocnemius 50 units  Right soleus 50 units     Left lower limb  Left medial gastrocnemius 50 units  Left lateral gastrocnemius 50 units  Left soleus 50 units    Total Dose: 350 Units, 50 Units botulinum toxin was wasted    Administrations This Visit  INCObotulinum toxin A (XEOMIN ) injection 400 Units       Admin Date  12/20/2023 Action  Given Dose  400 Units Route  Intramuscular Documented By  Gretel Ade, RN                     PLAN:   -The patient will be eligible for repeat botulinum toxin injections in 3-4 months if clinically indicated.  -Follow-up in this clinic in 4-8 weeks to assess efficacy of the above injections.  Consider eliminating future leg toxin pending results and focusing on finger flexors.  -Continue home splinting, stretching, positioning, and therapy programs.  -Plan to continue to review the option for surgical approaches to improve what appear to be fixed bilateral APF contractures including Achilles tendon release.    Lauraine CHRISTELLA Munch, MD

## 2023-12-25 ENCOUNTER — Encounter: Admit: 2023-12-25 | Discharge: 2023-12-25 | Payer: PRIVATE HEALTH INSURANCE

## 2023-12-28 ENCOUNTER — Encounter: Admit: 2023-12-28 | Discharge: 2023-12-28 | Payer: PRIVATE HEALTH INSURANCE

## 2024-01-15 ENCOUNTER — Encounter: Admit: 2024-01-15 | Discharge: 2024-01-15 | Payer: PRIVATE HEALTH INSURANCE

## 2024-01-22 ENCOUNTER — Encounter: Admit: 2024-01-22 | Discharge: 2024-01-22 | Payer: PRIVATE HEALTH INSURANCE

## 2024-01-22 ENCOUNTER — Ambulatory Visit: Admit: 2024-01-22 | Discharge: 2024-01-23 | Payer: PRIVATE HEALTH INSURANCE

## 2024-01-22 DIAGNOSIS — G5133 Clonic hemifacial spasm, bilateral: Secondary | ICD-10-CM

## 2024-01-22 DIAGNOSIS — G40209 Localization-related (focal) (partial) symptomatic epilepsy and epileptic syndromes with complex partial seizures, not intractable, without status epilepticus: Principal | ICD-10-CM

## 2024-01-22 MED ORDER — LEVETIRACETAM 100 MG/ML PO SOLN
ORAL | 3 refills | 90.00000 days | Status: AC
Start: 2024-01-22 — End: ?

## 2024-01-22 MED ORDER — GABAPENTIN 250 MG/5 ML (5 ML) PO SOLN
3 mL | Freq: Three times a day (TID) | ORAL | 3 refills | 30.00000 days | Status: AC
Start: 2024-01-22 — End: ?

## 2024-01-26 ENCOUNTER — Encounter: Admit: 2024-01-26 | Discharge: 2024-01-26 | Payer: PRIVATE HEALTH INSURANCE

## 2024-01-26 NOTE — Patient Instructions
 It was a pleasure to see you in clinic today.    Elwyn Lade RN, BSN  Clinical Nurse Coordinator  [facility]  [facility] [place] [facility]  [facility] [address] [place], [place] [address]  [phone]  Scheduling to make, cancel or change appointment [phone]    For prescription refills, please contact your pharmacy. Please allow 2-3 days for refill requests to be filled.    For more information on spine questions, please visit [email or web address]

## 2024-01-31 ENCOUNTER — Encounter: Admit: 2024-01-31 | Discharge: 2024-01-31 | Payer: PRIVATE HEALTH INSURANCE

## 2024-01-31 ENCOUNTER — Ambulatory Visit: Admit: 2024-01-31 | Discharge: 2024-02-01 | Payer: PRIVATE HEALTH INSURANCE

## 2024-01-31 VITALS — BP 107/71 | HR 74 | Ht 68.0 in | Wt 224.0 lb

## 2024-01-31 DIAGNOSIS — G8250 Quadriplegia, unspecified: Secondary | ICD-10-CM

## 2024-01-31 DIAGNOSIS — R252 Cramp and spasm: Secondary | ICD-10-CM

## 2024-01-31 DIAGNOSIS — S14104D Unspecified injury at C4 level of cervical spinal cord, subsequent encounter: Principal | ICD-10-CM

## 2024-01-31 DIAGNOSIS — S065X6S Traumatic subdural hemorrhage with loss of consciousness greater than 24 hours without return to pre-existing conscious level with patient surviving, sequela: Secondary | ICD-10-CM

## 2024-01-31 DIAGNOSIS — S069X9S Unspecified intracranial injury with loss of consciousness of unspecified duration, sequela: Secondary | ICD-10-CM
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
## 2024-03-01 ENCOUNTER — Encounter: Admit: 2024-03-01 | Discharge: 2024-03-01 | Payer: PRIVATE HEALTH INSURANCE

## 2024-03-12 ENCOUNTER — Encounter: Admit: 2024-03-12 | Discharge: 2024-03-12 | Payer: PRIVATE HEALTH INSURANCE

## 2024-03-18 ENCOUNTER — Encounter: Admit: 2024-03-18 | Discharge: 2024-03-18 | Payer: PRIVATE HEALTH INSURANCE

## 2024-03-19 ENCOUNTER — Encounter: Admit: 2024-03-19 | Discharge: 2024-03-19 | Payer: PRIVATE HEALTH INSURANCE

## 2024-03-19 ENCOUNTER — Ambulatory Visit: Admit: 2024-03-19 | Discharge: 2024-03-20 | Payer: PRIVATE HEALTH INSURANCE

## 2024-03-19 VITALS — Wt 224.0 lb

## 2024-03-19 DIAGNOSIS — R252 Cramp and spasm: Principal | ICD-10-CM

## 2024-03-19 MED ORDER — ONABOTULINUMTOXINA 100 UNIT IJ SOLR
400 [IU] | Freq: Once | INTRAMUSCULAR | 0 refills | Status: CP | PRN
Start: 2024-03-19 — End: ?

## 2024-03-19 NOTE — Progress Notes [1]
 Neurorehabilitation Medicine - Procedure Visit  Physical Medicine & Rehabilitation  [facility]    Date of Service: 03/19/2024    Date of last botulinum toxin injection (any body site):12/20/2023  Complications or side effects with last injections: No  Current or recent fevers, chills, cough, difficulty breathing: No (stable difficulty clearing secretions)  Currently taking antibiotics: No  Currently taking any anti-coagulant medications: No  Other spasticity medications: No, successfully weaned off of oral baclofen    Brief History/ Exam: 28 y.o. 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  No side effects from previous injections.    Since last visit had follow-up with Dr. Santana.  At that time discussed holding on Botox  to the bilateral lower extremities and discussing with orthopedic surgery about possible tendon release.  I had a long discussion with father today about this.  He wishes to continue pursuing Botox  in the lower extremities as well as the right upper extremity.  He would like to talk with his wife about surgical intervention for [facility] lower extremities.  I did clarify for him in regards to surgical intervention as well as what was discussed in the last visit.  He is in understanding of this and wishes to pursue further injections.  No significant changes requested to current injection regimen. Stable spastic tetraparesis on exam with bilateral ankle plantar flexion contractures.  She uses a left thumbs up to communicate yes.  They see more benefit with arm injections than they do with leg injections.  She is doing therapy with [facility] and using a standing frame / tilt table about once weekly.         There were no vitals filed for this visit.  Botox  Extremity Muscles    Date/Time: 03/19/2024 1:00 PM    Performed by: Edgar Elouise HERO, MD  Authorized by: Edgar Elouise HERO, MD  Consent obtained: written  Consent given by: patient  Procedural risks discussed:  Allergic reaction, bleeding, infection, damage to surrounding structures, pain and weakness  Risks and benefits: risks and benefits discussed  Patient states understanding of procedure being performed, patient's understanding of procedure matches consent, procedure consent matches procedure scheduled, relevant documents present and verified. Patient identity confirmed, immediately prior to the procedure a time out was called. If applicable, test results available and properly labeled, site marked, imaging studies available, required blood products, implants, devices and special equipment available.      Procedure:  1st Extremity Location: Left Arm and Right Arm  Number of Muscles: 1-4  Additional Extremity Location: Left Leg  Number of Muscles: 1-4  Additional Extremity Location: Right Leg  Number of Muscles: 1-4  EMG Guidance Charge: [id number] - EMG GUIDANCE  Medications administered: 400 Units ONAbotulinumtoxinA  100 Units/1 mL  Total Units Used: 400  Total Units Wasted: 0  Patient tolerance: patient tolerated the procedure well with no immediate complications            PROCEDURE NOTE - Botulinum Toxin Injection    Procedure Date: 03/19/2024  Procedure: Botulinum Toxin Injections   Indication/ Diagnosis:   1. Spasticity  CHEMODENERVATION EXTREMITY MUSCLES        Prior to beginning the procedure, an opportunity to review the risks (pain, bleeding, infection, muscle weakness, allergic reaction, dysphagia/ dyspnea) and benefits (spasticity reduction, improved positioning/ ROM, decreased burden of care) was offered and the patient and/or parents consented to botulinum toxin injections of the right  upper limb and bilateral lower limbs.       Medication: botulinumtoxinA  Total Amount Reconstituted: 400 Units   Dilution: 1:4    The botulinum toxin was reconstituted in 16 mL of preservative free saline in a 100 Units to 4 mL ratio.  A time out was performed to locate and confirm the correct limb for injection.  The skin was prepped with alcohol prior to each injection.  Audible EMG guidance was used to localize the desired muscles. The below listed amounts of botulinum toxin were injected into the below listed muscles via a 37 mm x 27 gauge needle electrode (unless otherwise specified) with aspiration prior to each injection.  The procedure was well tolerated with minimal bleeding that resolved after brief pressure was applied.    Muscles Injected:      Right Upper Limb  Right Flexor Digitorum Superficialis 50 Units  Right Flexor Digitorum Profundus 50 Units    Right lower limb  Right medial gastrocnemius 50 units  Right lateral gastrocnemius 50 units  Right soleus 50 units     Left lower limb  Left medial gastrocnemius 50 units  Left lateral gastrocnemius 50 units  Left soleus 50 units    Total Dose: 400 Units botulinum toxin was wasted           PLAN:   -The patient will be eligible for repeat botulinum toxin injections in 3-4 months if clinically indicated.  -Follow-up in 3 months   - consider eliminating future leg toxin pending results and focusing on finger flexors.  After discussion with father today we will continue with previous Botox  program.  They will think about orthopedic surgery for possible tendon release  -Continue home splinting, stretching, positioning, and therapy programs.  -Plan to continue to review the option for surgical approaches to improve what appear to be fixed bilateral APF contractures including Achilles tendon release.    Elouise CHRISTELLA Millers, MD
